# Patient Record
Sex: Female | Born: 1990 | Hispanic: Yes | Marital: Single | Smoking: Current every day smoker
Health system: Southern US, Community
[De-identification: ages and names within clinical notes are randomized; demographics above are authoritative.]

## PROBLEM LIST (undated history)

## (undated) DIAGNOSIS — D75839 Thrombocytosis, unspecified: Secondary | ICD-10-CM

## (undated) DIAGNOSIS — F1910 Other psychoactive substance abuse, uncomplicated: Secondary | ICD-10-CM

## (undated) DIAGNOSIS — F4310 Post-traumatic stress disorder, unspecified: Secondary | ICD-10-CM

## (undated) DIAGNOSIS — R7303 Prediabetes: Secondary | ICD-10-CM

## (undated) DIAGNOSIS — T50901A Poisoning by unspecified drugs, medicaments and biological substances, accidental (unintentional), initial encounter: Secondary | ICD-10-CM

## (undated) DIAGNOSIS — F419 Anxiety disorder, unspecified: Secondary | ICD-10-CM

---

## 2018-03-01 ENCOUNTER — Encounter (HOSPITAL_COMMUNITY): Payer: Self-pay | Admitting: Emergency Medicine

## 2018-03-01 ENCOUNTER — Other Ambulatory Visit: Payer: Self-pay

## 2018-03-01 ENCOUNTER — Emergency Department (HOSPITAL_COMMUNITY)
Admission: EM | Admit: 2018-03-01 | Discharge: 2018-03-01 | Discharge status: Against Medical Advice | Payer: Self-pay | Attending: Emergency Medicine | Admitting: Emergency Medicine

## 2018-03-01 DIAGNOSIS — F17210 Nicotine dependence, cigarettes, uncomplicated: Secondary | ICD-10-CM | POA: Insufficient documentation

## 2018-03-01 DIAGNOSIS — Y999 Unspecified external cause status: Secondary | ICD-10-CM | POA: Insufficient documentation

## 2018-03-01 DIAGNOSIS — S0033XA Contusion of nose, initial encounter: Secondary | ICD-10-CM | POA: Insufficient documentation

## 2018-03-01 DIAGNOSIS — Z5320 Procedure and treatment not carried out because of patient's decision for unspecified reasons: Secondary | ICD-10-CM | POA: Insufficient documentation

## 2018-03-01 DIAGNOSIS — Y939 Activity, unspecified: Secondary | ICD-10-CM | POA: Insufficient documentation

## 2018-03-01 DIAGNOSIS — Y929 Unspecified place or not applicable: Secondary | ICD-10-CM | POA: Insufficient documentation
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
## 2018-05-23 ENCOUNTER — Emergency Department (HOSPITAL_COMMUNITY): Payer: Self-pay

## 2018-05-23 ENCOUNTER — Emergency Department (HOSPITAL_COMMUNITY)
Admission: EM | Admit: 2018-05-23 | Discharge: 2018-05-23 | Disposition: A | Discharge status: Home / Self Care | Payer: Self-pay | Attending: Emergency Medicine | Admitting: Emergency Medicine

## 2018-05-23 ENCOUNTER — Encounter (HOSPITAL_COMMUNITY): Payer: Self-pay | Admitting: Emergency Medicine

## 2018-05-23 DIAGNOSIS — F17210 Nicotine dependence, cigarettes, uncomplicated: Secondary | ICD-10-CM | POA: Insufficient documentation

## 2018-05-23 DIAGNOSIS — T401X1A Poisoning by heroin, accidental (unintentional), initial encounter: Secondary | ICD-10-CM | POA: Insufficient documentation

## 2018-05-23 LAB — CBC WITH DIFFERENTIAL/PLATELET
Abs Immature Granulocytes: 0.03 10*3/uL (ref 0.00–0.07)
Basophils Absolute: 0 10*3/uL (ref 0.0–0.1)
Basophils Relative: 0 %
Eosinophils Absolute: 0.1 10*3/uL (ref 0.0–0.5)
Eosinophils Relative: 1 %
HCT: 38.6 % (ref 36.0–46.0)
Hemoglobin: 12 g/dL (ref 12.0–15.0)
Immature Granulocytes: 0 %
Lymphocytes Relative: 19 %
Lymphs Abs: 1.9 10*3/uL (ref 0.7–4.0)
MCH: 28.3 pg (ref 26.0–34.0)
MCHC: 31.1 g/dL (ref 30.0–36.0)
MCV: 91 fL (ref 80.0–100.0)
Monocytes Absolute: 0.3 10*3/uL (ref 0.1–1.0)
Monocytes Relative: 3 %
Neutro Abs: 8 10*3/uL — ABNORMAL HIGH (ref 1.7–7.7)
Neutrophils Relative %: 77 %
Platelets: 366 10*3/uL (ref 150–400)
RBC: 4.24 MIL/uL (ref 3.87–5.11)
RDW: 14.9 % (ref 11.5–15.5)
WBC: 10.4 10*3/uL (ref 4.0–10.5)
nRBC: 0 % (ref 0.0–0.2)

## 2018-05-23 LAB — COMPREHENSIVE METABOLIC PANEL
ALT: 37 U/L (ref 0–44)
AST: 100 U/L — AB (ref 15–41)
Albumin: 3.5 g/dL (ref 3.5–5.0)
Alkaline Phosphatase: 65 U/L (ref 38–126)
Anion gap: 6 (ref 5–15)
BUN: 8 mg/dL (ref 6–20)
CHLORIDE: 104 mmol/L (ref 98–111)
CO2: 27 mmol/L (ref 22–32)
Calcium: 8.8 mg/dL — ABNORMAL LOW (ref 8.9–10.3)
Creatinine, Ser: 0.71 mg/dL (ref 0.44–1.00)
GFR calc Af Amer: >60 mL/min (ref >60)
GFR calc non Af Amer: >60 mL/min (ref >60)
GLUCOSE: 79 mg/dL (ref 70–99)
Potassium: 3.8 mmol/L (ref 3.5–5.1)
SODIUM: 137 mmol/L (ref 135–145)
Total Bilirubin: 0.5 mg/dL (ref 0.3–1.2)
Total Protein: 7.5 g/dL (ref 6.5–8.1)

## 2018-05-23 LAB — CK: Total CK: 343 U/L — ABNORMAL HIGH (ref 38–234)

## 2018-05-23 LAB — I-STAT BETA HCG BLOOD, ED (MC, WL, AP ONLY): I-stat hCG, quantitative: <5 m[IU]/mL (ref <5)

## 2018-05-23 LAB — ETHANOL: Alcohol, Ethyl (B): <10 mg/dL (ref <10)

## 2018-05-23 MED ORDER — NALOXONE HCL 2 MG/2ML IJ SOSY: 2.0000 mg | PREFILLED_SYRINGE | Freq: Once | INTRAMUSCULAR | Status: DC

## 2018-05-23 MED ORDER — SODIUM CHLORIDE 0.9 % IV BOLUS
1000.0000 mL | Freq: Once | INTRAVENOUS | Status: AC
  Administered 2018-05-23: 1000 mL via INTRAVENOUS

## 2018-05-23 MED ORDER — NALOXONE HCL 4 MG/0.1ML NA LIQD: 1.0000 | Freq: Once | NASAL | Status: DC

## 2018-05-23 MED ORDER — NALOXONE HCL 2 MG/2ML IJ SOSY
PREFILLED_SYRINGE | INTRAMUSCULAR | Status: AC
  Filled 2018-05-23: qty 2

## 2018-05-23 MED ORDER — NALOXONE HCL 2 MG/2ML IJ SOSY
1.5000 mg | PREFILLED_SYRINGE | Freq: Once | INTRAMUSCULAR | Status: AC
  Administered 2018-05-23: 1.5 mg via INTRAVENOUS

## 2018-05-23 MED ORDER — NALOXONE HCL 4 MG/10ML IJ SOLN
0.7500 mg/h | INTRAVENOUS | Status: DC
  Filled 2018-05-23: qty 10

## 2018-05-23 NOTE — Discharge Instructions (Signed)
You are brought to the ER after being found unresponsive outside.  Your mental status and respiratory drive for depressed likely from heroin use.  You metabolized and returned to baseline after observation in the ER.  Labs, head CT and EKG were unremarkable.  We have given you Narcan intranasal to use as needed when there is similar mental status or respiratory depression.  See attached information.  See resources for outpatient substance abuse resources.  Return to the ER for chest pain, shortness of breath, fevers or similar decrease mentation and respiratory drive.
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
## 2018-05-23 NOTE — ED Notes (Addendum)
Pt's sister coming to get pt. Pt axox4

## 2018-05-23 NOTE — ED Notes (Signed)
Sister now at bedside. Pt arousable to abnoxious stimuli. Airway intact. VSS.

## 2018-05-23 NOTE — ED Notes (Signed)
Pt arousable to speech, staying awake more now. Pt to be d/c home with her sister driving. Sister will be given intranasal narcan to take home.

## 2018-05-23 NOTE — ED Notes (Signed)
Patient placed on 2L O2 [place].  
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
## 2018-05-23 NOTE — ED Notes (Signed)
Pt stood up and vomited in an emesis basin that was laying in the floor and then turned around and urinated in it. Then escorted back to bed. NAD.
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
## 2018-05-23 NOTE — ED Notes (Signed)
Pt in CT.

## 2018-06-17 ENCOUNTER — Emergency Department (HOSPITAL_COMMUNITY)
Admission: EM | Admit: 2018-06-17 | Discharge: 2018-06-17 | Disposition: A | Discharge status: Home / Self Care | Payer: Medicaid Other | Attending: Emergency Medicine | Admitting: Emergency Medicine

## 2018-06-17 ENCOUNTER — Other Ambulatory Visit: Payer: Self-pay

## 2018-06-17 DIAGNOSIS — F1420 Cocaine dependence, uncomplicated: Secondary | ICD-10-CM | POA: Insufficient documentation

## 2018-06-17 DIAGNOSIS — F1120 Opioid dependence, uncomplicated: Secondary | ICD-10-CM | POA: Insufficient documentation

## 2018-06-17 DIAGNOSIS — R45851 Suicidal ideations: Secondary | ICD-10-CM | POA: Insufficient documentation

## 2018-06-17 DIAGNOSIS — F332 Major depressive disorder, recurrent severe without psychotic features: Secondary | ICD-10-CM | POA: Insufficient documentation

## 2018-06-17 DIAGNOSIS — R462 Strange and inexplicable behavior: Secondary | ICD-10-CM

## 2018-06-17 DIAGNOSIS — F1320 Sedative, hypnotic or anxiolytic dependence, uncomplicated: Secondary | ICD-10-CM | POA: Insufficient documentation

## 2018-06-17 DIAGNOSIS — F1910 Other psychoactive substance abuse, uncomplicated: Secondary | ICD-10-CM

## 2018-06-17 LAB — CBC WITH DIFFERENTIAL/PLATELET
Abs Immature Granulocytes: 0.03 10*3/uL (ref 0.00–0.07)
Basophils Absolute: 0.1 10*3/uL (ref 0.0–0.1)
Basophils Relative: 1 %
Eosinophils Absolute: 0.2 10*3/uL (ref 0.0–0.5)
Eosinophils Relative: 2 %
HEMATOCRIT: 39.7 % (ref 36.0–46.0)
HEMOGLOBIN: 12.5 g/dL (ref 12.0–15.0)
Immature Granulocytes: 0 %
LYMPHS PCT: 36 %
Lymphs Abs: 3.6 10*3/uL (ref 0.7–4.0)
MCH: 28.5 pg (ref 26.0–34.0)
MCHC: 31.5 g/dL (ref 30.0–36.0)
MCV: 90.6 fL (ref 80.0–100.0)
MONO ABS: 0.9 10*3/uL (ref 0.1–1.0)
MONOS PCT: 9 %
NEUTROS ABS: 5.1 10*3/uL (ref 1.7–7.7)
Neutrophils Relative %: 52 %
Platelets: 380 10*3/uL (ref 150–400)
RBC: 4.38 MIL/uL (ref 3.87–5.11)
RDW: 14.6 % (ref 11.5–15.5)
WBC: 9.8 10*3/uL (ref 4.0–10.5)
nRBC: 0 % (ref 0.0–0.2)

## 2018-06-17 LAB — COMPREHENSIVE METABOLIC PANEL
ALT: 10 U/L (ref 0–44)
AST: 27 U/L (ref 15–41)
Albumin: 3.8 g/dL (ref 3.5–5.0)
Alkaline Phosphatase: 69 U/L (ref 38–126)
Anion gap: 9 (ref 5–15)
BUN: 9 mg/dL (ref 6–20)
CHLORIDE: 104 mmol/L (ref 98–111)
CO2: 24 mmol/L (ref 22–32)
CREATININE: 0.71 mg/dL (ref 0.44–1.00)
Calcium: 9.3 mg/dL (ref 8.9–10.3)
GFR calc Af Amer: >60 mL/min (ref >60)
GFR calc non Af Amer: >60 mL/min (ref >60)
Glucose, Bld: 85 mg/dL (ref 70–99)
Potassium: 3.5 mmol/L (ref 3.5–5.1)
SODIUM: 137 mmol/L (ref 135–145)
Total Bilirubin: 0.4 mg/dL (ref 0.3–1.2)
Total Protein: 8.2 g/dL — ABNORMAL HIGH (ref 6.5–8.1)

## 2018-06-17 LAB — ETHANOL: Alcohol, Ethyl (B): <10 mg/dL (ref <10)

## 2018-06-17 LAB — I-STAT BETA HCG BLOOD, ED (MC, WL, AP ONLY): I-stat hCG, quantitative: <5 m[IU]/mL (ref <5)

## 2018-06-17 MED ORDER — LORAZEPAM 1 MG PO TABS
1.0000 mg | ORAL_TABLET | Freq: Once | ORAL | Status: AC
  Administered 2018-06-17: 1 mg via ORAL
  Filled 2018-06-17: qty 1

## 2018-06-17 NOTE — ED Notes (Signed)
Per Teodoro Kil, [facility] patient is psych cleared at this time.

## 2018-06-17 NOTE — Discharge Instructions (Addendum)
There is help if you need it.  Please do not use dirty needles, this could cause you a severe infection to your skin, heart or spinal cord.  This could kill you or leave you permanently disabled.    There has been a recent study in this area that shows if you have an opiod overdose your risk of death is 12% in the next year.  Please follow up with help if you wish to seek it.   [facility] Solution to the Opioid Problem ([facility]STOP) Fixed; mobile; peer-based Chase Holleman [phone] [email or web address] Fixed site exchange at [facility], Wednesdays (2-5pm) and Thursdays (3-8pm). [address] [place], [place] [address] Call or text to arrange mobile and peer exchange, Mondays (1-4pm) and Fridays (4-7pm). Serving [place]

## 2018-06-17 NOTE — ED Triage Notes (Signed)
Pt BIB [facility]. EMS reports that patient admitted to using heroin and what she believes is fentanyl. Per EMS pt reported that she was suicidal and was trying to jump into traffic earlier today. Pt visibly upset upon arrival to ED. Pt denying any suicidal or homicidal thoughts at this time and states she "just wants to live." Pt does appear very restless upon arrival to ED.

## 2018-06-17 NOTE — BH Assessment (Signed)
TTS attempted to contact Pt's mother Shelly Rubenstein [phone].  There was not an answer and a HIPPA compliant message was left on the voicemail.

## 2018-06-17 NOTE — ED Notes (Signed)
Dinner tray ordered.

## 2018-06-17 NOTE — ED Notes (Signed)
Pt refusing to sign at this time. Pt given discharge instructions and follow up information. Pt verbalized understanding and given the opportunity to ask questions. Pt discharged from the ED.
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
## 2018-06-17 NOTE — ED Provider Notes (Signed)
[facility] EMERGENCY DEPARTMENT Provider Note   CSN: [id number] Arrival date & time: 06/17/18  1305    History   Chief Complaint Chief Complaint  Patient presents with  . Suicidal    HPI Allison Woodard is a 28 y.o. female.     28 year old female presents with bizarre behavior.  EMS reports that they picked the patient up at the side of the road where she was trying to jump in front of traffic.  She admits to using drugs today, thinks that she used fentanyl.  Admits to heroin use usually.  She reports snorting the drug.  She reports history of bipolar disorder, is not currently on medications for it.  She was agitated and combative for EMS and had to be placed in soft restraints.  LEVEL 5 CAVEAT DUE TO AMS  The history is provided by the EMS personnel. The history is limited by the condition of the patient.    No past medical history on file.  There are no active problems to display for this patient.   No past surgical history on file.   OB History   No obstetric history on file.      Home Medications    Prior to Admission medications   Not on File    Family History No family history on file.  Social History Social History   Tobacco Use  . Smoking status: Current Every Day Smoker  . Smokeless tobacco: Current User  Substance Use Topics  . Alcohol use: Not Currently  . Drug use: Yes    Types: Cocaine     Allergies   Patient has no known allergies.   Review of Systems Review of Systems  Unable to perform ROS: Mental status change     Physical Exam Updated Vital Signs BP 117/90 (BP Location: Right Arm)   Pulse 95   Temp 97.9 F (36.6 C) (Oral)   Resp 18   Ht 5' 2.5" (1.588 m)   Wt 49.9 kg   SpO2 99%   BMI 19.80 kg/m   Physical Exam Vitals signs and nursing note reviewed.  Constitutional:      Appearance: She is well-developed.     Comments: Disheveled, anxious  HENT:     Head: Normocephalic and atraumatic.   Nose: Nose normal.     Mouth/Throat:     Mouth: Mucous membranes are moist.  Eyes:     Conjunctiva/sclera: Conjunctivae normal.     Pupils: Pupils are equal, round, and reactive to light.  Neck:     Musculoskeletal: Neck supple.  Cardiovascular:     Rate and Rhythm: Normal rate and regular rhythm.     Heart sounds: Normal heart sounds. No murmur.  Pulmonary:     Effort: Pulmonary effort is normal.     Breath sounds: Normal breath sounds.  Abdominal:     General: Bowel sounds are normal. There is no distension.     Palpations: Abdomen is soft.     Tenderness: There is no abdominal tenderness.  Skin:    General: Skin is warm and dry.  Neurological:     Mental Status: She is alert and oriented to person, place, and time.     Comments: Fluent speech  Psychiatric:        Attention and Perception: She is inattentive.        Mood and Affect: Mood is anxious. Affect is labile.        Speech: Speech is rapid and pressured.  Behavior: Behavior is agitated.        Cognition and Memory: Cognition is impaired.        Judgment: Judgment is impulsive.      ED Treatments / Results  Labs (all labs ordered are listed, but only abnormal results are displayed) Labs Reviewed  COMPREHENSIVE METABOLIC PANEL  ETHANOL  RAPID URINE DRUG SCREEN, HOSP PERFORMED  CBC WITH DIFFERENTIAL/PLATELET  I-STAT BETA HCG BLOOD, ED ([facility], [facility], [facility] ONLY)    EKG None  Radiology No results found.  Procedures Procedures (including critical care time)  Medications Ordered in ED Medications  LORazepam (ATIVAN) tablet 1 mg (1 mg Oral Given 06/17/18 1351)     Initial Impression / Assessment and Plan / ED Course  I have reviewed the triage vital signs and the nursing notes.  Pertinent labs & imaging results that were available during my care of the patient were reviewed by me and considered in my medical decision making (see chart for details).       We were able to verbally de-escalate the  patient on arrival and take her out of soft restraints.  Gave oral Ativan.  Her behavior was concerning for potential self-harm therefore ordered sitter and TTS evaluation.  I am signing out to the oncoming provider who will follow up on TTS recommendations and screening lab work. If patient tries to leave prior to TTS evaluation, she will be IVC'd as she is altered 2/2 drug use and cannot reliably assess for safety.  Final Clinical Impressions(s) / ED Diagnoses   Final diagnoses:  None    ED Discharge Orders    None       Kyleeann Cremeans, Ambrose Finland, MD 06/17/18 1431

## 2018-06-17 NOTE — ED Notes (Signed)
Pt refusing lab work at this time

## 2018-06-17 NOTE — ED Provider Notes (Signed)
I received the patient in signout from Dr. Clarene Duke.  Briefly she is a 28 year old female that came in with a chief complaint of suicidal ideation after abusing drugs.  She has metabolized her illegal substances and is now no longer saying that she is suicidal.  She has been seen by TTS and they have cleared her psychiatrically.  Her lab work has returned and is unremarkable.   Melene Plan, DO 06/17/18 1755

## 2018-09-07 ENCOUNTER — Ambulatory Visit (HOSPITAL_COMMUNITY)
Admission: EM | Admit: 2018-09-07 | Discharge: 2018-09-07 | Disposition: A | Discharge status: Home / Self Care | Payer: Self-pay | Attending: Psychiatry | Admitting: Psychiatry

## 2018-09-07 ENCOUNTER — Emergency Department (HOSPITAL_COMMUNITY)
Admission: EM | Admit: 2018-09-07 | Discharge: 2018-09-07 | Disposition: A | Discharge status: Other Acute Inpatient Hospital | Payer: Medicaid Other | Attending: Emergency Medicine | Admitting: Emergency Medicine

## 2018-09-07 ENCOUNTER — Other Ambulatory Visit: Payer: Self-pay | Admitting: Family

## 2018-09-07 ENCOUNTER — Inpatient Hospital Stay (HOSPITAL_COMMUNITY)
Admission: AD | Admit: 2018-09-07 | Discharge: 2018-09-11 | DRG: 897 | Disposition: A | Discharge status: Home / Self Care | Payer: No Typology Code available for payment source | Source: Intra-hospital | Attending: Psychiatry | Admitting: Psychiatry

## 2018-09-07 DIAGNOSIS — F15259 Other stimulant dependence with stimulant-induced psychotic disorder, unspecified: Secondary | ICD-10-CM | POA: Diagnosis present

## 2018-09-07 DIAGNOSIS — R440 Auditory hallucinations: Secondary | ICD-10-CM | POA: Insufficient documentation

## 2018-09-07 DIAGNOSIS — F23 Brief psychotic disorder: Secondary | ICD-10-CM | POA: Insufficient documentation

## 2018-09-07 DIAGNOSIS — F22 Delusional disorders: Secondary | ICD-10-CM | POA: Diagnosis present

## 2018-09-07 DIAGNOSIS — F1410 Cocaine abuse, uncomplicated: Secondary | ICD-10-CM | POA: Insufficient documentation

## 2018-09-07 DIAGNOSIS — F609 Personality disorder, unspecified: Secondary | ICD-10-CM | POA: Diagnosis present

## 2018-09-07 DIAGNOSIS — Z046 Encounter for general psychiatric examination, requested by authority: Secondary | ICD-10-CM | POA: Insufficient documentation

## 2018-09-07 DIAGNOSIS — R451 Restlessness and agitation: Secondary | ICD-10-CM | POA: Insufficient documentation

## 2018-09-07 DIAGNOSIS — R456 Violent behavior: Secondary | ICD-10-CM | POA: Insufficient documentation

## 2018-09-07 DIAGNOSIS — Z1159 Encounter for screening for other viral diseases: Secondary | ICD-10-CM | POA: Insufficient documentation

## 2018-09-07 DIAGNOSIS — F17290 Nicotine dependence, other tobacco product, uncomplicated: Secondary | ICD-10-CM | POA: Insufficient documentation

## 2018-09-07 DIAGNOSIS — F309 Manic episode, unspecified: Secondary | ICD-10-CM | POA: Insufficient documentation

## 2018-09-07 DIAGNOSIS — Z590 Homelessness: Secondary | ICD-10-CM

## 2018-09-07 DIAGNOSIS — F29 Unspecified psychosis not due to a substance or known physiological condition: Secondary | ICD-10-CM | POA: Diagnosis present

## 2018-09-07 DIAGNOSIS — F17200 Nicotine dependence, unspecified, uncomplicated: Secondary | ICD-10-CM | POA: Diagnosis present

## 2018-09-07 DIAGNOSIS — Z781 Physical restraint status: Secondary | ICD-10-CM

## 2018-09-07 DIAGNOSIS — F15959 Other stimulant use, unspecified with stimulant-induced psychotic disorder, unspecified: Secondary | ICD-10-CM | POA: Diagnosis not present

## 2018-09-07 LAB — COMPREHENSIVE METABOLIC PANEL
ALT: 14 U/L (ref 0–44)
AST: 31 U/L (ref 15–41)
Albumin: 3.5 g/dL (ref 3.5–5.0)
Alkaline Phosphatase: 76 U/L (ref 38–126)
Anion gap: 10 (ref 5–15)
BUN: 14 mg/dL (ref 6–20)
CO2: 21 mmol/L — ABNORMAL LOW (ref 22–32)
Calcium: 8.6 mg/dL — ABNORMAL LOW (ref 8.9–10.3)
Chloride: 106 mmol/L (ref 98–111)
Creatinine, Ser: 0.7 mg/dL (ref 0.44–1.00)
GFR calc Af Amer: >60 mL/min (ref >60)
GFR calc non Af Amer: >60 mL/min (ref >60)
Glucose, Bld: 81 mg/dL (ref 70–99)
Potassium: 3.4 mmol/L — ABNORMAL LOW (ref 3.5–5.1)
Sodium: 137 mmol/L (ref 135–145)
Total Bilirubin: 0.4 mg/dL (ref 0.3–1.2)
Total Protein: 7.1 g/dL (ref 6.5–8.1)

## 2018-09-07 LAB — ETHANOL: Alcohol, Ethyl (B): <10 mg/dL (ref <10)

## 2018-09-07 LAB — RAPID URINE DRUG SCREEN, HOSP PERFORMED
Amphetamines: POSITIVE — AB
Barbiturates: NOT DETECTED
Benzodiazepines: NOT DETECTED
Cocaine: NOT DETECTED
Opiates: NOT DETECTED
Tetrahydrocannabinol: NOT DETECTED

## 2018-09-07 LAB — CBC WITH DIFFERENTIAL/PLATELET
Abs Immature Granulocytes: 0.01 10*3/uL (ref 0.00–0.07)
Basophils Absolute: 0 10*3/uL (ref 0.0–0.1)
Basophils Relative: 1 %
Eosinophils Absolute: 0 10*3/uL (ref 0.0–0.5)
Eosinophils Relative: 0 %
HCT: 39.2 % (ref 36.0–46.0)
Hemoglobin: 12.5 g/dL (ref 12.0–15.0)
Immature Granulocytes: 0 %
Lymphocytes Relative: 28 %
Lymphs Abs: 1.2 10*3/uL (ref 0.7–4.0)
MCH: 28 pg (ref 26.0–34.0)
MCHC: 31.9 g/dL (ref 30.0–36.0)
MCV: 87.7 fL (ref 80.0–100.0)
Monocytes Absolute: 0.6 10*3/uL (ref 0.1–1.0)
Monocytes Relative: 15 %
Neutro Abs: 2.3 10*3/uL (ref 1.7–7.7)
Neutrophils Relative %: 56 %
Platelets: 274 10*3/uL (ref 150–400)
RBC: 4.47 MIL/uL (ref 3.87–5.11)
RDW: 14.3 % (ref 11.5–15.5)
WBC: 4.2 10*3/uL (ref 4.0–10.5)
nRBC: 0 % (ref 0.0–0.2)

## 2018-09-07 LAB — URINALYSIS, ROUTINE W REFLEX MICROSCOPIC
Bilirubin Urine: NEGATIVE
Glucose, UA: NEGATIVE mg/dL
Ketones, ur: 20 mg/dL — AB
Leukocytes,Ua: NEGATIVE
Nitrite: NEGATIVE
Protein, ur: NEGATIVE mg/dL
RBC / HPF: >50 RBC/hpf — ABNORMAL HIGH (ref 0–5)
Specific Gravity, Urine: 1.033 — ABNORMAL HIGH (ref 1.005–1.030)
pH: 5 (ref 5.0–8.0)

## 2018-09-07 LAB — PROTIME-INR
INR: 1.1 (ref 0.8–1.2)
Prothrombin Time: 14.4 seconds (ref 11.4–15.2)

## 2018-09-07 LAB — SARS CORONAVIRUS 2 BY RT PCR (HOSPITAL ORDER, PERFORMED IN ~~LOC~~ HOSPITAL LAB): SARS Coronavirus 2: NEGATIVE

## 2018-09-07 LAB — ACETAMINOPHEN LEVEL: Acetaminophen (Tylenol), Serum: <10 ug/mL — ABNORMAL LOW (ref 10–30)

## 2018-09-07 LAB — SALICYLATE LEVEL: Salicylate Lvl: <7 mg/dL (ref 2.8–30.0)

## 2018-09-07 LAB — I-STAT BETA HCG BLOOD, ED (MC, WL, AP ONLY): I-stat hCG, quantitative: <5 m[IU]/mL (ref <5)

## 2018-09-07 LAB — TSH: TSH: 0.673 u[IU]/mL (ref 0.350–4.500)

## 2018-09-07 MED ORDER — SODIUM CHLORIDE 0.9 % IV BOLUS
1000.0000 mL | Freq: Once | INTRAVENOUS | Status: AC
  Administered 2018-09-07: 1000 mL via INTRAVENOUS

## 2018-09-07 MED ORDER — MAGNESIUM HYDROXIDE 400 MG/5ML PO SUSP: 30.0000 mL | Freq: Every day | ORAL | Status: DC | PRN

## 2018-09-07 MED ORDER — TRAZODONE HCL 50 MG PO TABS: 50.0000 mg | ORAL_TABLET | Freq: Every evening | ORAL | Status: DC | PRN

## 2018-09-07 MED ORDER — ZIPRASIDONE MESYLATE 20 MG IM SOLR
10.0000 mg | Freq: Once | INTRAMUSCULAR | Status: AC
  Administered 2018-09-07: 12:00:00 10 mg via INTRAMUSCULAR
  Filled 2018-09-07: qty 20

## 2018-09-07 MED ORDER — TRAZODONE HCL 50 MG PO TABS
50.0000 mg | ORAL_TABLET | Freq: Every evening | ORAL | Status: DC | PRN
  Administered 2018-09-09 – 2018-09-10 (×3): 50 mg via ORAL
  Filled 2018-09-07 (×13): qty 1

## 2018-09-07 MED ORDER — STERILE WATER FOR INJECTION IJ SOLN
INTRAMUSCULAR | Status: AC
  Filled 2018-09-07: qty 10

## 2018-09-07 MED ORDER — OLANZAPINE 5 MG PO TBDP
5.0000 mg | ORAL_TABLET | Freq: Once | ORAL | Status: AC
  Administered 2018-09-08: 03:00:00 5 mg via ORAL
  Filled 2018-09-07 (×2): qty 1

## 2018-09-07 MED ORDER — LORAZEPAM 1 MG PO TABS
1.0000 mg | ORAL_TABLET | Freq: Once | ORAL | Status: AC
  Administered 2018-09-08: 1 mg via ORAL
  Filled 2018-09-07: qty 1

## 2018-09-07 MED ORDER — ACETAMINOPHEN 325 MG PO TABS: 650.0000 mg | ORAL_TABLET | Freq: Four times a day (QID) | ORAL | Status: DC | PRN

## 2018-09-07 MED ORDER — ALUM & MAG HYDROXIDE-SIMETH 200-200-20 MG/5ML PO SUSP: 30.0000 mL | ORAL | Status: DC | PRN

## 2018-09-07 NOTE — ED Notes (Signed)
Called communications for transport to [facility]

## 2018-09-07 NOTE — ED Notes (Signed)
MD PFEIFFER TO BEDSIDE TO ASSESS PT

## 2018-09-07 NOTE — ED Notes (Signed)
Pt arrives being carried by [employer] in handcuffs. Per [employer], mother stated that pt was manic and delusional onset last evening and she called [employer] as "she couldn't have her in her house anymore". Pt appears manic and acutely psychotic on arrival. She is perseverating on the idea of "my sister is dead". She is having violent outbursts, striking out and kicking staff members. Intermittently screaming and non compliant w/ instruction. Security called to bedside, [employer] remains at bedside. MD Pfeiffer to bedside to assess need for restraint and medication.

## 2018-09-07 NOTE — ED Provider Notes (Signed)
MSE was initiated and I personally evaluated the patient and placed orders (if any) at  12:12 PM on September 07, 2018.  The patient appears stable so that the remainder of the MSE may be completed by another provider.  Patient seen in triage.  Referred from behavioral health for psychotic behavior, been brought to be HH by [employer].  Significant use of polysubstance abuse.  Patient is extremely paranoid and disheveled.  She is agitated and alert in appearance.  Constant stream of conversation much of a paranoid and delusional.  Heart is regular.  Lungs are clear.  Pupils are symmetric 3 mm responsive with normal extraocular motions.  All 4 extremities are strong and working in coordinated fashion.  I was asked to assess patient in triage for agitation.  Patient to be given Geodon 10 mg IM and start psychiatric clearance evaluation.  Patient is psychotic at this time.  She is agitated and physically aggressive.   Arby Barrette, MD 09/07/18 1215

## 2018-09-07 NOTE — BH Assessment (Signed)
Assessment Note  Allison Woodard is an 28 y.o. female presenting voluntarily to [facility] for assessment via [employer]. Since arrival [facility] provider has initiated IVC. Patient is a poor historian due to AMS so chart review and collateral information was used to complete assessment. Patient appears manic and delusional. Patient's speech is rapid, aggressive and her thoughts are disorganized. Patient stated several times that she may be pregnant. Patient appears suspicious calling this writer a "bitch." Per chart review, patient does not have any prior psychiatric history, however has a history of polysubstance use. Patient was assessed by TTS in 06/2018 following a heroin overdose and was psychiatrically cleared. Patient needs to be medically cleared as symptoms may be substance induced.  Per [employer]: Patient voluntarily came to [facility] via [employer] after her mother contacted them due to her erratic behavior. After arriving to [facility] patient showed a change in presentation, becoming agitated.   Patient appeared disoriented. Her speech is aggressive and thoughts are disorganized. Her mood is irritable and affect is congruent. She has poor insight, judgement, and impulse control.  Diagnosis: F23 Brief psychotic disorder   R/O Substance induced psychosis  Past Medical History: No past medical history on file.  No past surgical history on file.  Family History: No family history on file.  Social History:  reports that she has been smoking. She uses smokeless tobacco. She reports previous alcohol use. She reports current drug use. Drug: Cocaine.  Additional Social History:  Alcohol / Drug Use Pain Medications: see MAR Prescriptions: see MAR Over the Counter: see MAR History of alcohol / drug use?: Yes Substance #1 Name of Substance 1: heroin 1 - Age of First Use: 15 1 - Amount (size/oz): $10 worth 1 - Frequency: UTA 1 - Duration: UTA 1 - Last Use / Amount: UTA Substance #2 Name of Substance 2: Ecstacy 2 - Age of First Use:  UTA 2 - Amount (size/oz): UTA 2 - Frequency: UTA 2 - Duration: UTA 2 - Last Use / Amount: UTA Substance #3 Name of Substance 3: cocaine 3 - Age of First Use: UTA 3 - Amount (size/oz): UTA 3 - Frequency: UTA 3 - Duration: UTA 3 - Last Use / Amount: UTA Substance #4 Name of Substance 4: Percocets 4 - Age of First Use: UTA 4 - Amount (size/oz): UTA 4 - Frequency: UTA 4 - Duration: UTA 4 - Last Use / Amount: UTA  CIWA: CIWA-Ar BP: (!) 131/104 Pulse Rate: (!) 115 COWS:    Allergies: No Known Allergies  Home Medications: (Not in a hospital admission)   OB/GYN Status:  No LMP recorded.  General Assessment Data Location of Assessment: [facility] TTS Assessment: In system Is this a Tele or Face-to-Face Assessment?: Face-to-Face Is this an Initial Assessment or a Re-assessment for this encounter?: Initial Assessment Patient Accompanied by:: ([employer]) Language Other than English: No Living Arrangements: (mother) What gender do you identify as?: Female Marital status: (UTA) Maiden name: Weitzel Pregnancy Status: Other (Comment)(patient expressed concern she may be pregnant) Living Arrangements: Parent Can pt return to current living arrangement?: (UTA) Admission Status: Involuntary Petitioner: ED Attending Is patient capable of signing voluntary admission?: No Referral Source: Self/Family/Friend Insurance type: None     Crisis Care Plan Living Arrangements: Parent Legal Guardian: (self) Name of Psychiatrist: none Name of Therapist: none  Education Status Is patient currently in school?: (UTA)  Risk to self with the past 6 months Suicidal Ideation: No Has patient been a risk to self within the past 6 months  prior to admission? : Yes Suicidal Intent: No Has patient had any suicidal intent within the past 6 months prior to admission? : No Is patient at risk for suicide?: No Suicidal Plan?: No Has patient had any suicidal plan within the past 6 months  prior to admission? : No Access to Means: No What has been your use of drugs/alcohol within the last 12 months?: polysubstance use Previous Attempts/Gestures: No How many times?: 0 Other Self Harm Risks: substance use Triggers for Past Attempts: Unknown Intentional Self Injurious Behavior: None Family Suicide History: Unable to assess Recent stressful life event(s): (UTA) Persecutory voices/beliefs?: No Depression: (UTA) Depression Symptoms: (UTA) Substance abuse history and/or treatment for substance abuse?: No Suicide prevention information given to non-admitted patients: Not applicable  Risk to Others within the past 6 months Homicidal Ideation: No Does patient have any lifetime risk of violence toward others beyond the six months prior to admission? : No Thoughts of Harm to Others: No Current Homicidal Intent: No Current Homicidal Plan: No Access to Homicidal Means: No Identified Victim: none History of harm to others?: No Assessment of Violence: None Noted Violent Behavior Description: none noted Does patient have access to weapons?: No Criminal Charges Pending?: (UTA) Does patient have a court date: (UTA) Is patient on probation?: (UTA)  Psychosis Hallucinations: None noted Delusions: Persecutory, Unspecified  Mental Status Report Appearance/Hygiene: Disheveled Eye Contact: Poor Motor Activity: Hyperactivity, Mannerisms Speech: Aggressive, Loud, Word salad Level of Consciousness: Irritable, Alert Mood: Irritable Affect: Irritable Anxiety Level: Minimal Thought Processes: Flight of Ideas Judgement: Impaired Orientation: Unable to assess Obsessive Compulsive Thoughts/Behaviors: None  Cognitive Functioning Concentration: Poor Memory: Unable to Assess Is patient IDD: No Insight: Poor Impulse Control: Poor Appetite: (UTA) Have you had any weight changes? : (UTA) Sleep: Unable to Assess Total Hours of Sleep: (UTA) Vegetative Symptoms: Unable to  Assess  ADLScreening [facility] Assessment Services) Patient's cognitive ability adequate to safely complete daily activities?: Yes Patient able to express need for assistance with ADLs?: Yes Independently performs ADLs?: Yes (appropriate for developmental age)  Prior Inpatient Therapy Prior Inpatient Therapy: No  Prior Outpatient Therapy Prior Outpatient Therapy: No Does patient have an ACCT team?: No Does patient have Intensive In-House Services?  : No Does patient have [facility] services? : No Does patient have P4CC services?: No  ADL Screening (condition at time of admission) Patient's cognitive ability adequate to safely complete daily activities?: Yes Is the patient deaf or have difficulty hearing?: No Does the patient have difficulty seeing, even when wearing glasses/contacts?: No Does the patient have difficulty concentrating, remembering, or making decisions?: Yes Patient able to express need for assistance with ADLs?: Yes Does the patient have difficulty dressing or bathing?: No Independently performs ADLs?: Yes (appropriate for developmental age) Does the patient have difficulty walking or climbing stairs?: No Weakness of Legs: None Weakness of Arms/Hands: None  Home Assistive Devices/Equipment Home Assistive Devices/Equipment: None  Therapy Consults (therapy consults require a physician order) PT Evaluation Needed: No OT Evalulation Needed: No SLP Evaluation Needed: No Abuse/Neglect Assessment (Assessment to be complete while patient is alone) Abuse/Neglect Assessment Can Be Completed: Unable to assess, patient is non-responsive or altered mental status Values / Beliefs Cultural Requests During Hospitalization: None Spiritual Requests During Hospitalization: None Consults Spiritual Care Consult Needed: No Social Work Consult Needed: No Merchant navy officer (For Healthcare) Does Patient Have a Medical Advance Directive?: Unable to assess, patient is non-responsive or  altered mental status          Disposition: Alcario Drought  Melvyn NethLewis, NP recommends in patient treatment. Patient moved to ED for medical clearance.  Disposition Initial Assessment Completed for this Encounter: Yes [facility] Patient refused recommended treatment: No  On Site Evaluation by:   Reviewed with Physician:    Celedonio MiyamotoMeredith  Anquanette Bahner 09/07/2018 11:30 AM

## 2018-09-07 NOTE — ED Provider Notes (Signed)
Pt has been medically cleared for psychiatric treatment.    Raeford Razor, MD 09/07/18 [phone]

## 2018-09-07 NOTE — ED Triage Notes (Signed)
Per [employer], pt arrives from [facility] and was sent over here "because she was too agitated". [employer] was over at [facility] w/ pt for approx 90 minutes, during which she became increasingly agitated and started becoming destructive and combative while in their department. [employer] was initially called by pts mother who stated she was a danger to "everyone in the house" d/t her acute psychoses. Pt unable to provide hx to this RN as she is too combative at this time

## 2018-09-07 NOTE — ED Provider Notes (Signed)
Tried taking pt out of restraints. When stimulated though she becomes markedly agitated. Only able to take R wrist out and she began fighting and trying to bite. Not able to redirect or reason with her at all. Unfortunately had to re-restrain.    Raeford Razor, MD 09/07/18 [phone]

## 2018-09-07 NOTE — Progress Notes (Signed)
Pt up trying to go into other peoples rooms. Pt irritable and has no insight , pt rude and disrespectful

## 2018-09-07 NOTE — ED Provider Notes (Signed)
[facility] Provider Note   CSN: [id number] Arrival date & time: 09/07/18  1153    History   Chief Complaint Chief Complaint  Patient presents with  . Medical Clearance  . Psychiatric Evaluation    HPI Allison Woodard is a 28 y.o. female.    Level 5 caveat secondary to behavioral health disorder and possible overdose HPI  28 [place] old [place] [employer] with agitation, psychosis, and probable drug overdose.  Per the reports she has been hearing voices and threatening her mother.  Initial IVC papers taken out and signed at overall health.  She is unable to me any history.  No past medical history on file.  There are no active problems to display for this patient.   No past surgical history on file.   OB History   No obstetric history on file.      Home Medications    Prior to Admission medications   Not on File    Family History No family history on file.  Social History Social History   Tobacco Use  . Smoking status: Current Every Day Smoker  . Smokeless tobacco: Current User  Substance Use Topics  . Alcohol use: Not Currently  . Drug use: Yes    Types: Cocaine     Allergies   Patient has no known allergies.   Review of Systems Review of Systems  Unable to perform ROS: Mental status change     Physical Exam Updated Vital Signs BP 111/72   Pulse (!) 101   Temp 98.2 F (36.8 C) (Oral)   Resp 15   SpO2 99%   Physical Exam Vitals signs and nursing note reviewed.  Constitutional:      Appearance: She is ill-appearing.     Comments: Patient brought in by [employer] and initially with some thrashing and yelling and placed in four-point restraints  HENT:     Head: Normocephalic and atraumatic.     Right Ear: External ear normal.     Left Ear: External ear normal.     Mouth/Throat:     Mouth: Mucous membranes are moist.  Eyes:     Comments: Pupils are pinpoint  Neck:   Musculoskeletal: Normal range of motion.  Cardiovascular:     Rate and Rhythm: Normal rate and regular rhythm.     Pulses: Normal pulses.  Pulmonary:     Breath sounds: Normal breath sounds.  Abdominal:     General: Abdomen is flat.  Musculoskeletal: Normal range of motion.  Skin:    General: Skin is warm.     Capillary Refill: Capillary refill takes less than 2 seconds.  Neurological:     General: No focal deficit present.     Comments: Patient is agitated and intermittently unresponsive  Psychiatric:        Attention and Perception: She perceives auditory hallucinations.        Mood and Affect: Affect is inappropriate.        Speech: She is noncommunicative.        Behavior: Behavior is agitated.        Cognition and Memory: Cognition is impaired.      ED Treatments / Results  Labs (all labs ordered are listed, but only abnormal results are displayed) Labs Reviewed  COMPREHENSIVE METABOLIC PANEL - Abnormal; Notable for the following components:      Result Value   Potassium 3.4 (*)    CO2 21 (*)    Calcium  8.6 (*)    All other components within normal limits  ACETAMINOPHEN LEVEL - Abnormal; Notable for the following components:   Acetaminophen (Tylenol), Serum <10 (*)    All other components within normal limits  ETHANOL  SALICYLATE LEVEL  CBC WITH DIFFERENTIAL/PLATELET  PROTIME-INR  TSH  URINALYSIS, ROUTINE W REFLEX MICROSCOPIC  RAPID URINE DRUG SCREEN, HOSP PERFORMED  I-STAT BETA HCG BLOOD, ED ([facility], [facility], [facility] ONLY)    EKG EKG Interpretation  Date/Time:  Monday September 07 2018 12:50:40 EDT Ventricular Rate:  104 PR Interval:    QRS Duration: 73 QT Interval:  320 QTC Calculation: 421 R Axis:   26 Text Interpretation:  Sinus tachycardia Otherwise within normal limits Confirmed by Margarita Grizzle [phone]) on 09/07/2018 3:18:49 PM   Radiology No results found.  Procedures Procedures (including critical care time)  Medications Ordered in ED Medications  sterile  water (preservative free) injection (has no administration in time range)  ziprasidone (GEODON) injection 10 mg (10 mg Intramuscular Given 09/07/18 1210)  sodium chloride 0.9 % bolus 1,000 mL (1,000 mLs Intravenous New Bag/Given 09/07/18 1500)     Initial Impression / Assessment and Plan / ED Course  I have reviewed the triage vital signs and the nursing notes.  Pertinent labs & imaging results that were available during my care of the patient were reviewed by me and considered in my medical decision making (see chart for details).       Patient with reports of psychotic behavior as well as possible overdose Labs pending She appears hemodynamically stable Discussed with Dr. Juleen China and he will reevaluate after labs return Final Clinical Impressions(s) / ED Diagnoses   Final diagnoses:  None    ED Discharge Orders    None       Margarita Grizzle, MD 09/09/18 1220

## 2018-09-07 NOTE — ED Notes (Signed)
Pt was trailed off violent restraints and patient starting kicking and screaming at staff. Pt attempted to punch this Rn and Tammy Sours, Charity fundraiser. Security and MD Juleen China was called into the room. Pt was put back in violent restraints and MD notified of need for new order.

## 2018-09-07 NOTE — ED Notes (Signed)
Pt placed in violent restraints 

## 2018-09-07 NOTE — Progress Notes (Signed)
Pt accepted to [facility], Bed 500-1 ONCE PATIENT IS MEDICALLY CLEARED Hillery Jacks, NP is the accepting provider.  Malvin Johns, MD is the attending provider.  Call report to [phone]  [facility] ED notified.   Pt is IVC .  Pt may be transported by [facility] Pt scheduled  to arrive at [facility] once the patient is medically cleared by EDP.  Timmothy Euler. Kaylyn Lim, MSW, LCSW Disposition Clinical Social Work [phone] (cell) [phone] (office)

## 2018-09-07 NOTE — ED Notes (Signed)
Pt belongings in locker #2 

## 2018-09-07 NOTE — H&P (Signed)
Behavioral Health Medical Screening Exam  Allison Woodard is an 28 y.o. female.  Total Time spent with patient: 15 minutes  [name] 28 yo female present with [employer] due to  patient's "bazarr behavior" was reported patient has been using opioid and started talking to herself. Observed responding to internal stimuli. pressures speech and disjointed conservations. patient is pressured displayed paranoid and worsening delusional thoughts. Patient was IVC'd and sent to emergency department for medical clearances.   Psychiatric Specialty Exam: Physical Exam  ROS  Blood pressure (!) 131/104, pulse (!) 115, temperature 98.4 F (36.9 C), temperature source Oral, resp. rate 20, SpO2 91 %.There is no height or weight on file to calculate BMI.  General Appearance: Bizarre and Disheveled  Eye Contact:  Minimal  Speech:  Pressured  Volume:  Increased  Mood:  Anxious, Depressed and Dysphoric  Affect:  Inappropriate and Labile  Thought Process:  Disorganized and Descriptions of Associations: Loose  Orientation:  NA  Thought Content:  Delusions, Hallucinations: Auditory, Paranoid Ideation, Rumination and Tangential  Suicidal Thoughts:  N/A  Homicidal Thoughts:  N/A  Memory:  NA  Judgement:  Impaired  Insight:  Lacking  Psychomotor Activity:  Increased and Restlessness  Concentration: Concentration: Poor  Recall:  Poor  Fund of Knowledge:Poor  Language: Fair  Akathisia:  No  Handed:  Right  AIMS (if indicated):     Assets:  Communication Skills Desire for Improvement Resilience Social Support  Sleep:       Musculoskeletal: Strength & Muscle Tone: within normal limits Gait & Station: normal Patient leans: N/A  Blood pressure (!) 131/104, pulse (!) 115, temperature 98.4 F (36.9 C), temperature source Oral, resp. rate 20, SpO2 91 %.  Recommendations: Inpatient admission after medical clearance  Based on my evaluation the patient appears to have an emergency medical condition for which I  recommend the patient be transferred to the emergency department for further evaluation.  Oneta Rack, NP 09/07/2018, 11:39 AM

## 2018-09-07 NOTE — ED Notes (Signed)
Original IVC paperwork placed in red folder in purple zone

## 2018-09-07 NOTE — ED Notes (Signed)
SPOKE w/ MD PFEIFFER OVER PHONE WHO GAVE VERBAL ORDER TO RESTRAIN PT W/ VIOLENT RESTRAINTS. MD PFEIFFER ALREADY HAS ASSESSED PT IN PERSON.

## 2018-09-08 ENCOUNTER — Encounter (HOSPITAL_COMMUNITY): Payer: Self-pay

## 2018-09-08 ENCOUNTER — Other Ambulatory Visit: Payer: Self-pay

## 2018-09-08 DIAGNOSIS — F15959 Other stimulant use, unspecified with stimulant-induced psychotic disorder, unspecified: Secondary | ICD-10-CM

## 2018-09-08 MED ORDER — RISPERIDONE 2 MG PO TABS
2.0000 mg | ORAL_TABLET | Freq: Two times a day (BID) | ORAL | Status: DC
  Administered 2018-09-08 – 2018-09-09 (×2): 2 mg via ORAL
  Filled 2018-09-08 (×7): qty 1

## 2018-09-08 MED ORDER — TEMAZEPAM 30 MG PO CAPS
30.0000 mg | ORAL_CAPSULE | Freq: Every day | ORAL | Status: DC
  Administered 2018-09-09 – 2018-09-10 (×2): 30 mg via ORAL
  Filled 2018-09-08 (×2): qty 1

## 2018-09-08 MED ORDER — GABAPENTIN 300 MG PO CAPS
300.0000 mg | ORAL_CAPSULE | Freq: Three times a day (TID) | ORAL | Status: DC
  Administered 2018-09-08 – 2018-09-11 (×4): 300 mg via ORAL
  Filled 2018-09-08 (×4): qty 1
  Filled 2018-09-08: qty 21
  Filled 2018-09-08: qty 1
  Filled 2018-09-08: qty 21
  Filled 2018-09-08: qty 1
  Filled 2018-09-08: qty 21
  Filled 2018-09-08 (×6): qty 1

## 2018-09-08 MED ORDER — BENZTROPINE MESYLATE 0.5 MG PO TABS
0.5000 mg | ORAL_TABLET | Freq: Two times a day (BID) | ORAL | Status: DC
  Administered 2018-09-08 – 2018-09-09 (×2): 0.5 mg via ORAL
  Filled 2018-09-08 (×11): qty 1

## 2018-09-08 MED ORDER — LORAZEPAM 2 MG/ML IJ SOLN
2.0000 mg | Freq: Four times a day (QID) | INTRAMUSCULAR | Status: DC
  Administered 2018-09-08 – 2018-09-10 (×2): 2 mg via INTRAMUSCULAR
  Filled 2018-09-08 (×3): qty 1

## 2018-09-08 MED ORDER — LORAZEPAM 1 MG PO TABS
2.0000 mg | ORAL_TABLET | Freq: Four times a day (QID) | ORAL | Status: DC
  Administered 2018-09-08 – 2018-09-11 (×5): 2 mg via ORAL
  Filled 2018-09-08 (×6): qty 2

## 2018-09-08 NOTE — Progress Notes (Signed)
While staff was attending to another patient , Allison Woodard was up behind the nursing station rambling through the drawers. Pt was informed she could not do that that staff could get he what she wanted. Pt continues to present with a oppositional tone and defiant voice.

## 2018-09-08 NOTE — [facility] Suicide Risk Assessment (Signed)
[facility] Admission Suicide Risk Assessment   Nursing information obtained from:  Patient Demographic factors:  Caucasian Current Mental Status:  NA Loss Factors:  NA Historical Factors:  NA Risk Reduction Factors:  NA  Total Time spent with patient: 45 minutes Principal Problem: Amphetamine induced psychotic symptoms and behavior Diagnosis:  Active Problems:   Psychosis (HCC)   Methamphetamine-induced psychotic disorder (HCC)  Subjective Data: Petition for involuntary commitment/continues to be a poor historian/mother phone for collateral information  Continued Clinical Symptoms:  Alcohol Use Disorder Identification Test Final Score (AUDIT): 2 The "Alcohol Use Disorders Identification Test", Guidelines for Use in Primary Care, Second Edition.  World Science writer [facility]). Score between 0-7:  no or low risk or alcohol related problems. Score between 8-15:  moderate risk of alcohol related problems. Score between 16-19:  high risk of alcohol related problems. Score 20 or above:  warrants further diagnostic evaluation for alcohol dependence and treatment.   CLINICAL FACTORS:   Currently Psychotic   Musculoskeletal: Strength & Muscle Tone: spastic Gait & Station: unsteady Patient leans: N/A  Psychiatric Specialty Exam: Physical Exam  Constitutional: She appears well-nourished.  Neck: Neck supple.  Cardiovascular: Normal rate.  Respiratory: Breath sounds normal.    Review of Systems  Constitutional: Negative.   Gastrointestinal: Negative.   Neurological: Negative.   Endo/Heme/Allergies: Negative.     Blood pressure 107/74, pulse 96, temperature (!) 97 F (36.1 C), temperature source Oral, resp. rate 16, height 5\' 2"  (1.575 m), weight 47.6 kg, last menstrual period 08/31/2018.Body mass index is 19.2 kg/m.  General Appearance: Disheveled  Eye Contact:  None  Speech:  Garbled  Volume:  Decreased  Mood:  Dysphoric and Irritable  Affect:  Flat  Thought Process:   Disorganized, Irrelevant and Descriptions of Associations: Circumstantial  Orientation:  Other:  Presumed person place general situation did not answer questions regarding full orientation  Thought Content:  Illogical and Tangential  Suicidal Thoughts:  neg  Homicidal Thoughts:  No  Memory:  Immediate;   Poor  Judgement:  Impaired  Insight:  Lacking  Psychomotor Activity:  Increased  Concentration:  Concentration: Fair  Recall:  Poor  Fund of Knowledge:  Poor  Language:  Poor  Akathisia:  Negative  Handed:  Right  AIMS (if indicated):     Assets:  Leisure Time Physical Health  ADL's:  Intact  Cognition:  WNL  Sleep:  Number of Hours: 5.25      COGNITIVE FEATURES THAT CONTRIBUTE TO RISK:  Loss of executive function    SUICIDE RISK:   Minimal: No identifiable suicidal ideation.  Patients presenting with no risk factors but with morbid ruminations; may be classified as minimal risk based on the severity of the depressive symptoms  PLAN OF CARE: Mid for stabilization/greatest risk to this patient would be a nonintentional overdose  I certify that inpatient services furnished can reasonably be expected to improve the patient's condition.   Malvin Johns, MD 09/08/2018, 9:43 AM

## 2018-09-08 NOTE — Progress Notes (Signed)
Recreation Therapy Notes  Date: 6.2.20 Time: 1000 Location: 500 Hall Dayroom  Group Topic: Wellness  Goal Area(s) Addresses:  Patient will define components of whole wellness. Patient will verbalize benefit of whole wellness.  Intervention:  Exercise, Music   Activity:  Exercise.  LRT led group in a series of stretches.  Each group member given the opportunity to lead the group in an exercise of their choosing.  Patients could take breaks and get water as needed.  Education: Wellness, Building control surveyor.   Education Outcome: Acknowledges education/In group clarification offered/Needs additional education.   Clinical Observations/Feedback: Pt did not attend group session.    Caroll Rancher, LRT/CTRS         Caroll Rancher A 09/08/2018 11:53 AM

## 2018-09-08 NOTE — Progress Notes (Signed)
Admission Note:  28 yr female who presents IVC in no acute distress for the treatment of psychosis. Pt was very confrontational and labile during admission. Pt was sarcastic at times and was answering questions with answers  that did not appear to be the truth , due to pt responses  " I guess, If you say so). Pt constantly would interrupt pt before finishing statement then pt would respond with "I know". Writer tried to explain the IVC process and how her stay here can go, but pt was not receptive at this time. Writer gave pt all admission  information ( ie, code #, unit rules, [facility] process, etc...) pt appeared not to be listening.   A:Skin was assessed and found to be clear of any abnormal marks apart from a tattoo R-side, Bruise R-knee PT searched and no contraband found, POC and unit policies explained and understanding verbalized. Consents obtained. Food and fluids offered, and  accepted.   R: Pt had no additional questions

## 2018-09-08 NOTE — H&P (Signed)
Psychiatric Admission Assessment Adult  Patient Identification: Allison Woodard MRN:  [id number] Date of Evaluation:  09/08/2018 Chief Complaint:  Psychosis Principal Diagnosis: Acute psychosis/disorganized and volatile behaviors on presentation. Diagnosis:  Active Problems:   Psychosis (HCC)   Methamphetamine-induced psychotic disorder (HCC)  History of Present Illness:   This is the first psychiatric admission here, but the second lifetime admission overall as far as we know for this 28 year old patient who presented under petition for involuntary commitment. Though her history is limited as far as her own volunteering of information, she acknowledges a 7372-month binging.  Of methamphetamine usage, stating she uses "as much as she can" this has resulted in a psychotic state. When she presented on 6/1 she was described as delusional, manic, aggressive and required eventually seclusion and restraint.  Mother was phone for collateral information by the assessment team and myself. Patient herself keeps drifting off to sleep and provides minimal and disjointed answers. She does however deny auditory and visual hallucinations and thoughts of harming self or others and does seem to understand those questions temporarily. 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  Mother reports the patient is in fact homeless will sometimes come home to "bother her" but she has not had contact with her other than this in over 6 months.  Further the patient had [place] in [place] area, for 3822-month period of time, the mother is unclear as to what the diagnosis was.  There is also a reference to heroin dependency versus abuse as well as the methamphetamine usage but on this presentation, drug screen only positive for amphetamines.  Associated Signs/Symptoms: Depression Symptoms:  psychomotor agitation, (Hypo) Manic Symptoms:   Delusions, Distractibility, Anxiety Symptoms:  n/a Psychotic Symptoms:  Paranoia, PTSD Symptoms:ukn NA Total Time spent with patient: 45 minutes  Past Psychiatric History: 1 prior admission as far as we know no current psychiatric medications  Is the patient at risk to self? Yes.    Has the patient been a risk to self in the past 6 months? No.  Has the patient been a risk to self within the distant past? No.  Is the patient a risk to others? Yes.    Has the patient been a risk to others in the past 6 months? No.  Has the patient been a risk to others within the distant past? No.   Prior Inpatient Therapy:   Prior Outpatient Therapy:    Alcohol Screening: 1. How often do you have a drink containing alcohol?: Monthly or less 2. How many drinks containing alcohol do you have on a typical day when you are drinking?: 1 or 2 3. How often do you have six or more drinks on one occasion?: Never AUDIT-C Score: 1 4. How often during the last year have you found that you were not able to stop drinking once you had started?: Less than monthly 5. How often during the last year have you failed to do what was normally expected from you becasue of drinking?: Never 6. How often during the last year have you needed a first drink in the morning to get yourself going after a heavy drinking session?: Never 7. How often during the last year have you had a feeling of guilt of remorse after drinking?: Never 8. How often during the last year have you been unable to remember what happened the night before because you had been drinking?: Never 9. Have you or someone  else been injured as a result of your drinking?: No 10. Has a relative or friend or a doctor or another health worker been concerned about your drinking or suggested you cut down?: No Alcohol Use Disorder Identification Test Final Score (AUDIT): 2 Substance Abuse History in the last 12 months:  Yes.   Consequences of Substance Abuse: Medical  Consequences:  see eval Previous Psychotropic Medications: Yes  Psychological Evaluations: No  Past Medical History: History reviewed. No pertinent past medical history. History reviewed. No pertinent surgical history. Family History: History reviewed. No pertinent family history. Family Psychiatric  History: neg- from [place] Tobacco Screening:   Social History:  Social History   Substance and Sexual Activity  Alcohol Use Not Currently     Social History   Substance and Sexual Activity  Drug Use Yes  . Types: Cocaine    Additional Social History:                           Allergies:  No Known Allergies Lab Results:  Results for orders placed or performed during the hospital encounter of 09/07/18 (from the past 48 hour(s))  Comprehensive metabolic panel     Status: Abnormal   Collection Time: 09/07/18 12:58 PM  Result Value Ref Range   Sodium 137 135 - 145 mmol/L   Potassium 3.4 (L) 3.5 - 5.1 mmol/L   Chloride 106 98 - 111 mmol/L   CO2 21 (L) 22 - 32 mmol/L   Glucose, Bld 81 70 - 99 mg/dL   BUN 14 6 - 20 mg/dL   Creatinine, Ser 4.15 0.44 - 1.00 mg/dL   Calcium 8.6 (L) 8.9 - 10.3 mg/dL   Total Protein 7.1 6.5 - 8.1 g/dL   Albumin 3.5 3.5 - 5.0 g/dL   AST 31 15 - 41 U/L   ALT 14 0 - 44 U/L   Alkaline Phosphatase 76 38 - 126 U/L   Total Bilirubin 0.4 0.3 - 1.2 mg/dL   GFR calc non Af Amer >60 >60 mL/min   GFR calc Af Amer >60 >60 mL/min   Anion gap 10 5 - 15    Comment: Performed at [facility], [address] [place], [place] [address]  Ethanol     Status: None   Collection Time: 09/07/18 12:58 PM  Result Value Ref Range   Alcohol, Ethyl (B) <10 <10 mg/dL    Comment: (NOTE) Lowest detectable limit for serum alcohol is 10 mg/dL. For medical purposes only. Performed at [facility], [address], [place], [place] [address]   Salicylate level     Status: None   Collection Time: 09/07/18 12:58 PM  Result Value Ref Range   Salicylate  Lvl <7.0 2.8 - 30.0 mg/dL    Comment: Performed at [facility], [address], [place], [place] [address]  Acetaminophen level     Status: Abnormal   Collection Time: 09/07/18 12:58 PM  Result Value Ref Range   Acetaminophen (Tylenol), Serum <10 (L) 10 - 30 ug/mL    Comment: (NOTE) Therapeutic concentrations vary significantly. A range of 10-30 ug/mL  may be an effective concentration for many patients. However, some  are best treated at concentrations outside of this range. Acetaminophen concentrations >150 ug/mL at 4 hours after ingestion  and >50 ug/mL at 12 hours after ingestion are often associated with  toxic reactions. Performed at [facility], [address] [place],  [place] [address]   CBC with Differential     Status: None   Collection Time: 09/07/18 12:58 PM  Result Value Ref Range   WBC 4.2 4.0 - 10.5 K/uL   RBC 4.47 3.87 - 5.11 MIL/uL   Hemoglobin 12.5 12.0 - 15.0 g/dL   HCT 60.4 54.0 - 98.1 %   MCV 87.7 80.0 - 100.0 fL   MCH 28.0 26.0 - 34.0 pg   MCHC 31.9 30.0 - 36.0 g/dL   RDW 19.1 47.8 - 29.5 %   Platelets 274 150 - 400 K/uL   nRBC 0.0 0.0 - 0.2 %   Neutrophils Relative % 56 %   Neutro Abs 2.3 1.7 - 7.7 K/uL   Lymphocytes Relative 28 %   Lymphs Abs 1.2 0.7 - 4.0 K/uL   Monocytes Relative 15 %   Monocytes Absolute 0.6 0.1 - 1.0 K/uL   Eosinophils Relative 0 %   Eosinophils Absolute 0.0 0.0 - 0.5 K/uL   Basophils Relative 1 %   Basophils Absolute 0.0 0.0 - 0.1 K/uL   Immature Granulocytes 0 %   Abs Immature Granulocytes 0.01 0.00 - 0.07 K/uL    Comment: Performed at [facility], [address], [place], [place] [address]  Protime-INR     Status: None   Collection Time: 09/07/18 12:58 PM  Result Value Ref Range   Prothrombin Time 14.4 11.4 - 15.2 seconds   INR 1.1 0.8 - 1.2    Comment: (NOTE) INR goal varies based on device and disease states. Performed at [facility], [address] [place], [place] [address]   TSH      Status: None   Collection Time: 09/07/18 12:58 PM  Result Value Ref Range   TSH 0.673 0.350 - 4.500 uIU/mL    Comment: Performed by a 3rd Generation assay with a functional sensitivity of <=0.01 uIU/mL. Performed at [facility], [address] [place], [place] [address]   I-Stat beta hCG blood, ED     Status: None   Collection Time: 09/07/18  3:25 PM  Result Value Ref Range   I-stat hCG, quantitative <5.0 <5 mIU/mL   Comment 3            Comment:   GEST. AGE      CONC.  (mIU/mL)   <=1 WEEK        5 - 50     2 WEEKS       50 - 500     3 WEEKS       100 - 10,000     4 WEEKS     1,000 - 30,000        FEMALE AND NON-PREGNANT FEMALE:     LESS THAN 5 mIU/mL   Urinalysis, Routine w reflex microscopic     Status: Abnormal   Collection Time: 09/07/18  4:30 PM  Result Value Ref Range   Color, Urine YELLOW YELLOW   APPearance HAZY (A) CLEAR   Specific Gravity, Urine 1.033 (H) 1.005 - 1.030   pH 5.0 5.0 - 8.0   Glucose, UA NEGATIVE NEGATIVE mg/dL   Hgb urine dipstick MODERATE (A) NEGATIVE   Bilirubin Urine NEGATIVE NEGATIVE   Ketones, ur 20 (A) NEGATIVE mg/dL   Protein, ur NEGATIVE NEGATIVE mg/dL   Nitrite NEGATIVE NEGATIVE   Leukocytes,Ua NEGATIVE NEGATIVE   RBC / HPF >50 (H) 0 - 5 RBC/hpf   WBC, UA 0-5 0 - 5 WBC/hpf   Bacteria, UA  RARE (A) NONE SEEN   Squamous Epithelial / LPF 0-5 0 - 5   Mucus PRESENT     Comment: Performed at [facility], [address], [place], [place] [address]  Urine rapid drug screen (hosp performed)     Status: Abnormal   Collection Time: 09/07/18  4:30 PM  Result Value Ref Range   Opiates NONE DETECTED NONE DETECTED   Cocaine NONE DETECTED NONE DETECTED   Benzodiazepines NONE DETECTED NONE DETECTED   Amphetamines POSITIVE (A) NONE DETECTED   Tetrahydrocannabinol NONE DETECTED NONE DETECTED   Barbiturates NONE DETECTED NONE DETECTED    Comment: (NOTE) DRUG SCREEN FOR MEDICAL PURPOSES ONLY.  IF CONFIRMATION IS NEEDED FOR ANY PURPOSE,  NOTIFY LAB WITHIN 5 DAYS. LOWEST DETECTABLE LIMITS FOR URINE DRUG SCREEN Drug Class                     Cutoff (ng/mL) Amphetamine and metabolites    1000 Barbiturate and metabolites    200 Benzodiazepine                 200 Tricyclics and metabolites     300 Opiates and metabolites        300 Cocaine and metabolites        300 THC                            50 Performed at [facility], [address], [place], [place] [address]   SARS Coronavirus 2 (CEPHEID - Performed in [facility] hospital lab), Hosp Order     Status: None   Collection Time: 09/07/18  7:27 PM  Result Value Ref Range   SARS Coronavirus 2 NEGATIVE NEGATIVE    Comment: (NOTE) If result is NEGATIVE SARS-CoV-2 target nucleic acids are NOT DETECTED. The SARS-CoV-2 RNA is generally detectable in upper and lower  respiratory specimens during the acute phase of infection. The lowest  concentration of SARS-CoV-2 viral copies this assay can detect is 250  copies / mL. A negative result does not preclude SARS-CoV-2 infection  and should not be used as the sole basis for treatment or other  patient management decisions.  A negative result may occur with  improper specimen collection / handling, submission of specimen other  than nasopharyngeal swab, presence of viral mutation(s) within the  areas targeted by this assay, and inadequate number of viral copies  (<250 copies / mL). A negative result must be combined with clinical  observations, patient history, and epidemiological information. If result is POSITIVE SARS-CoV-2 target nucleic acids are DETECTED. The SARS-CoV-2 RNA is generally detectable in upper and lower  respiratory specimens dur ing the acute phase of infection.  Positive  results are indicative of active infection with SARS-CoV-2.  Clinical  correlation with patient history and other diagnostic information is  necessary to determine patient infection status.  Positive results do  not rule out  bacterial infection or co-infection with other viruses. If result is PRESUMPTIVE POSTIVE SARS-CoV-2 nucleic acids MAY BE PRESENT.   A presumptive positive result was obtained on the submitted specimen  and confirmed on repeat testing.  While 2019 novel coronavirus  (SARS-CoV-2) nucleic acids may be present in the submitted sample  additional confirmatory testing may be necessary for epidemiological  and / or clinical management purposes  to differentiate between  SARS-CoV-2 and other Sarbecovirus currently known to infect humans.  If clinically indicated additional testing  with an alternate test  methodology [phone]) is advised. The SARS-CoV-2 RNA is generally  detectable in upper and lower respiratory sp ecimens during the acute  phase of infection. The expected result is Negative. Fact Sheet for Patients:  BoilerBrush.com.cy Fact Sheet for Healthcare Providers: https://pope.com/ This test is not yet approved or cleared by the [place] FDA and has been authorized for detection and/or diagnosis of SARS-CoV-2 by FDA under an Emergency Use Authorization (EUA).  This EUA will remain in effect (meaning this test can be used) for the duration of the COVID-19 declaration under Section 564(b)(1) of the Act, 21 U.S.C. section 360bbb-3(b)(1), unless the authorization is terminated or revoked sooner. Performed at [facility], [address], [place], [place] [address]     Blood Alcohol level:  Lab Results  Component Value Date   ETH <10 09/07/2018   ETH <10 06/17/2018    Metabolic Disorder Labs:  No results found for: HGBA1C, MPG No results found for: PROLACTIN No results found for: CHOL, TRIG, HDL, CHOLHDL, VLDL, LDLCALC  Current Medications: Current Facility-Administered Medications  Medication Dose Route Frequency Provider Last Rate Last Dose  . acetaminophen (TYLENOL) tablet 650 mg  650 mg Oral Q6H PRN Oneta Rack,  NP      . alum & mag hydroxide-simeth (MAALOX/MYLANTA) 200-200-20 MG/5ML suspension 30 mL  30 mL Oral Q4H PRN Oneta Rack, NP      . benztropine (COGENTIN) tablet 0.5 mg  0.5 mg Oral BID Malvin Johns, MD      . gabapentin (NEURONTIN) capsule 300 mg  300 mg Oral TID Malvin Johns, MD      . LORazepam (ATIVAN) tablet 2 mg  2 mg Oral Q6H Malvin Johns, MD       Or  . LORazepam (ATIVAN) injection 2 mg  2 mg Intramuscular Q6H Malvin Johns, MD      . magnesium hydroxide (MILK OF MAGNESIA) suspension 30 mL  30 mL Oral Daily PRN Oneta Rack, NP      . risperiDONE (RISPERDAL) tablet 2 mg  2 mg Oral BID Malvin Johns, MD      . temazepam (RESTORIL) capsule 30 mg  30 mg Oral QHS Malvin Johns, MD      . traZODone (DESYREL) tablet 50 mg  50 mg Oral QHS,MR X 1 Jackelyn Poling, NP   Stopped at 09/08/18 0103   PTA Medications: No medications prior to admission.    Musculoskeletal: Strength & Muscle Tone: spastic Gait & Station: unsteady Patient leans: N/A  Psychiatric Specialty Exam: Physical Exam  Constitutional: She appears well-nourished.  Neck: Neck supple.  Cardiovascular: Normal rate.  Respiratory: Breath sounds normal.    Review of Systems  Constitutional: Negative.   Gastrointestinal: Negative.   Neurological: Negative.   Endo/Heme/Allergies: Negative.     Blood pressure 107/74, pulse 96, temperature (!) 97 F (36.1 C), temperature source Oral, resp. rate 16, height  (1.575 m), weight 47.6 kg, last menstrual period 08/31/2018.Body mass index is 19.2 kg/m.  General Appearance: Disheveled  Eye Contact:  None  Speech:  Garbled  Volume:  Decreased  Mood:  Dysphoric and Irritable  Affect:  Flat  Thought Process:  Disorganized, Irrelevant and Descriptions of Associations: Circumstantial  Orientation:  Other:  Presumed person place general situation did not answer questions regarding full orientation  Thought Content:  Illogical and Tangential  Suicidal Thoughts:  neg   Homicidal Thoughts:  No  Memory:  Immediate;   Poor  Judgement:  Impaired  Insight:  Lacking  Psychomotor Activity:  Increased  Concentration:  Concentration: Fair  Recall:  Poor  Fund of Knowledge:  Poor  Language:  Poor  Akathisia:  Negative  Handed:  Right  AIMS (if indicated):     Assets:  Leisure Time Physical Health  ADL's:  Intact  Cognition:  WNL  Sleep:  Number of Hours: 5.25      Treatment Plan Summary: Daily contact with patient to assess and evaluate symptoms and progress in treatment and Medication management  Observation Level/Precautions:  15 minute checks  Laboratory:  UDS  Psychotherapy: Reality based therapy  Medications: Begin antipsychotic therapy  Consultations:  n/a  Discharge Concerns:    Estimated LOS:5 day  Other:    1-methamphetamine do psychosis/methamphetamine dependence/history of opiate dependency Axis II defer Axis III no acute medical findings other than drug-induced akathisia/spasticity Axis IV-substance abuse/homelessness  Axis V 20 high this year 50   Physician Treatment Plan for Primary Diagnosis: <principal problem not specified> Long Term Goal(s): Improvement in symptoms so as ready for discharge  Short Term Goals: Ability to identify and develop effective coping behaviors will improve and Ability to maintain clinical measurements within normal limits will improve  Physician Treatment Plan for Secondary Diagnosis: Active Problems:   Psychosis (HCC)   Methamphetamine-induced psychotic disorder (HCC)  Long Term Goal(s): Improvement in symptoms so as ready for discharge  Short Term Goals: Ability to identify and develop effective coping behaviors will improve and Ability to maintain clinical measurements within normal limits will improve  I certify that inpatient services furnished can reasonably be expected to improve the patient's condition.    Malvin Johns, MD 6/2/20209:49 AM

## 2018-09-08 NOTE — Plan of Care (Signed)
Staff attempted to talk to patient today, to take medications, to use coping skills, but patient refused.

## 2018-09-08 NOTE — Tx Team (Signed)
Initial Treatment Plan 09/08/2018 1:06 AM Harlow Asa EML:[id number]    PATIENT STRESSORS: Medication change or noncompliance Substance abuse   PATIENT STRENGTHS: General fund of knowledge Supportive family/friends   PATIENT IDENTIFIED PROBLEMS:  Psychosis  "nothing wrong with me"                   DISCHARGE CRITERIA:  Improved stabilization in mood, thinking, and/or behavior Verbal commitment to aftercare and medication compliance  PRELIMINARY DISCHARGE PLAN: Attend aftercare/continuing care group Attend PHP/IOP Outpatient therapy  PATIENT/FAMILY INVOLVEMENT: This treatment plan has been presented to and reviewed with the patient, Allison Woodard.  The patient and family have been given the opportunity to ask questions and make suggestions.  Delos Haring, RN 09/08/2018, 1:06 AM

## 2018-09-08 NOTE — Progress Notes (Signed)
D: Pt been in room appeared sleep. When interaction tried to be made, pt ignored Clinical research associate.   A: Pt was offered support and encouragement.  Pt was encourage to attend groups. Q 15 minute checks were done for safety.   R: safety maintained on unit.

## 2018-09-08 NOTE — Progress Notes (Signed)
Patient has been laying in bed today, sleeping at times.  When staff comes into room to talk to patient, patient smiles, ignores staff, rolls from side to side in bed.  Patient did take oral medications this morning and at lunch time.  Patient refused oral medications at dinner time.  Patient continued to push nurse's hand away.  Patient was told to take p.o. meds or an IM ativan would be given.  Patient continued to refuse p.o. meds.  Ativan IM 2 mg given patient.  Patient closed her eyes and refused to talk to staff. Respirations even and unlabored.  No signs/symptoms of pain/distress noted on patient's face/body movements.   Safety maintained with 15 minute checks.

## 2018-09-09 NOTE — Progress Notes (Signed)
Pt walked over to the dirty clothes hamper ad put trash in it, pt was informed to take the trash ou" If you give me some gloves I will do it " pt continues to be oppositional and defiant towards staff

## 2018-09-09 NOTE — [facility] Group Notes (Signed)
Pt was invited but did not attend orientation/goals group. 

## 2018-09-09 NOTE — Tx Team (Signed)
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 acetaminophen (TYLENOL) tablet 650 mg  650 mg Oral Q6H PRN Derrill Center, NP      . alum & mag hydroxide-simeth (MAALOX/MYLANTA) 200-200-20 MG/5ML suspension 30 mL  30 mL Oral Q4H PRN Derrill Center, NP      . benztropine (COGENTIN) tablet 0.5 mg  0.5 mg Oral BID Johnn Hai, MD   0.5 mg at 09/09/18 0755  . gabapentin (NEURONTIN) capsule 300 mg  300 mg Oral TID Johnn Hai, MD   300 mg at 09/09/18 1207  . LORazepam (ATIVAN) tablet 2 mg  2 mg Oral Q6H Johnn Hai, MD   2 mg at 09/09/18 1207   Or  . LORazepam (ATIVAN) injection 2 mg  2 mg Intramuscular Q6H Johnn Hai, MD   2 mg at 09/08/18 1655  . magnesium hydroxide (MILK OF MAGNESIA) suspension 30 mL  30 mL Oral Daily PRN Derrill Center, NP      . risperiDONE (RISPERDAL) tablet 2 mg  2 mg Oral BID Johnn Hai, MD   2 mg at 09/09/18 0755  . temazepam (RESTORIL) capsule 30 mg  30 mg Oral QHS Johnn Hai, MD      . traZODone (DESYREL) tablet 50 mg  50 mg Oral QHS,MR X 1 Rozetta Nunnery, NP   Stopped at 09/08/18 0103   PTA Medications: No medications prior to admission.    Patient Stressors: Medication change or noncompliance Substance abuse  Patient Strengths: General fund of knowledge Supportive family/friends  Treatment Modalities: Medication Management, Group therapy, Case management,  1 to 1 session with clinician, Psychoeducation, Recreational therapy.   Physician Treatment Plan for Primary Diagnosis: <principal problem not specified> Long Term Goal(s): Improvement in symptoms so as ready for  discharge Improvement in symptoms so as ready for discharge   Short Term Goals: Ability to identify and develop effective coping behaviors will improve Ability to maintain clinical measurements within normal limits will improve Ability to identify and develop effective coping behaviors will improve Ability to maintain clinical measurements within normal limits will improve  Medication Management: Evaluate patient's response, side effects, and tolerance of medication regimen.  Therapeutic Interventions: 1 to 1 sessions, Unit Group sessions and Medication administration.  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  Therapeutic Interventions: 1 to 1 sessions, Unit Group sessions and Medication administration.  Evaluation of Outcomes: Not Met   RN Treatment Plan for Primary Diagnosis: <principal problem not specified> Long Term Goal(s): Knowledge of disease and therapeutic regimen to maintain health will improve  Short Term Goals: Ability to identify and develop effective coping behaviors will improve  and Compliance with prescribed medications will improve  Medication Management: RN will administer medications as ordered by provider, will assess and evaluate patient's response and provide education to patient for prescribed  medication. RN will report any adverse and/or side effects to prescribing provider.  Therapeutic Interventions: 1 on 1 counseling sessions, Psychoeducation, Medication administration, Evaluate responses to treatment, Monitor vital signs and CBGs as ordered, Perform/monitor CIWA, COWS, AIMS and Fall Risk screenings as ordered, Perform wound care treatments as ordered.  Evaluation of Outcomes: Not Met   LCSW Treatment Plan for Primary Diagnosis: <principal problem not specified> Long Term Goal(s): Safe transition to appropriate next level of care at discharge, Engage patient in therapeutic group addressing interpersonal concerns.  Short Term Goals: Engage patient in aftercare planning with referrals and resources, Increase social support and Increase skills for wellness and recovery  Therapeutic Interventions: Assess for all discharge needs, 1 to 1 time with Social worker, Explore available resources and support systems, Assess for adequacy in community support network, Educate family and significant other(s) on suicide prevention, Complete Psychosocial Assessment, Interpersonal group therapy.  Evaluation of Outcomes: Not Met   Progress in Treatment: Attending groups: No. Participating in groups: No. Taking medication as prescribed: Yes. Toleration medication: Yes. Family/Significant other contact made: No, will contact:  when given permission Patient understands diagnosis: Yes. Discussing patient identified problems/goals with staff: Yes. Medical problems stabilized or resolved: Yes. Denies suicidal/homicidal ideation: Yes. Issues/concerns per patient self-inventory: No. Other: none  New problem(s) identified: No, Describe:  none  New Short Term/Long Term Goal(s):  Patient Goals:    Discharge Plan or Barriers:   Reason for Continuation of Hospitalization: Delusions  Medication stabilization  Estimated Length of Stay:3-5 days.  Attendees: Patient: 09/09/2018   Physician: Dr.  Jake Samples, MD 09/09/2018   Nursing: Elesa Massed, RN 09/09/2018   RN Care Manager: 09/09/2018   Social Worker: Lurline Idol, LCSW 09/09/2018   Recreational Therapist:  09/09/2018   Other:  09/09/2018   Other:  09/09/2018   Other: 09/09/2018     Scribe for Treatment Team: Joanne Chars, [place] 09/09/2018 2:23 PM

## 2018-09-09 NOTE — Progress Notes (Signed)
Recreation Therapy Notes  Date:  6.3.20 Time: 1000 Location: 500 Hall Dayroom  Group Topic: Stress Management  Goal Area(s) Addresses:  Patient will identify factors that contribute to stress. Patient will identify factors that protect against stress.  Intervention:  Worksheet, pencils  Activity :  Stress Exploration.  Patients were to identify daily hassles, major life changes and life circumstances.  Patients were to then identify daily uplifts, healthy coping strategies and protective factors.    Education:  Stress Management, Discharge Planning.   Education Outcome: Acknowledges Education  Clinical Observations/Feedback: Pt did not attend group.    Markey Deady, LRT/CTRS         Allison Woodard 09/09/2018 12:11 PM 

## 2018-09-09 NOTE — Progress Notes (Signed)
[facility] MD Progress Note  09/09/2018 8:52 AM Allison Woodard  MRN:  [id number] Subjective:   Patient continues to be disorganized in general she has refused the majority of her medications and required IM lorazepam last evening. When she is aroused she picks at the air and asks for "ice cream" and she is mumbling and making disjointed statements some of them unintelligible. She is oriented though she knows that she is hospitalized at [facility] does not elaborate on day date or time. Ignore some examiners according to notes. Mother's history indicates patient's been homeless for some time intermittently abusing compounds and again has presented with a methamphetamine induced psychosis that is only partially resolved at present, may have induced a more permanent psychosis.  Compliance is encouraged Principal Problem: Methamphetamine induced psychosis, rule out permanence of this condition Diagnosis: Active Problems:   Psychosis ([facility])   Methamphetamine-induced psychotic disorder ([facility])  Total Time spent with patient: 20 minutes  Past Psychiatric History: Not elaborated  Past Medical History: History reviewed. No pertinent past medical history. History reviewed. No pertinent surgical history. Family History: History reviewed. No pertinent family history. Family Psychiatric  History: Unknown to patient Social History:  Social History   Substance and Sexual Activity  Alcohol Use Not Currently     Social History   Substance and Sexual Activity  Drug Use Yes  . Types: Cocaine    Social History   Socioeconomic History  . Marital status: Single    Spouse name: Not on file  . Number of children: Not on file  . Years of education: Not on file  . Highest education level: Not on file  Occupational History  . Not on file  Social Needs  . Financial resource strain: Not on file  . Food insecurity:    Worry: Not on file    Inability: Not on file  . Transportation needs:    Medical: Not on file     Non-medical: Not on file  Tobacco Use  . Smoking status: Current Every Day Smoker  . Smokeless tobacco: Current User  Substance and Sexual Activity  . Alcohol use: Not Currently  . Drug use: Yes    Types: Cocaine  . Sexual activity: Not on file  Lifestyle  . Physical activity:    Days per week: Not on file    Minutes per session: Not on file  . Stress: Not on file  Relationships  . Social connections:    Talks on phone: Not on file    Gets together: Not on file    Attends religious service: Not on file    Active member of club or organization: Not on file    Attends meetings of clubs or organizations: Not on file    Relationship status: Not on file  Other Topics Concern  . Not on file  Social History Narrative  . Not on file   Additional Social History:                         Sleep: Fair  Appetite:  Fair  Current Medications: Current Facility-Administered Medications  Medication Dose Route Frequency Provider Last Rate Last Dose  . acetaminophen (TYLENOL) tablet 650 mg  650 mg Oral Q6H PRN Oneta Rack, NP      . alum & mag hydroxide-simeth (MAALOX/MYLANTA) 200-200-20 MG/5ML suspension 30 mL  30 mL Oral Q4H PRN Oneta Rack, NP      . benztropine (COGENTIN) tablet 0.5 mg  0.5 mg Oral BID Malvin Johns, MD   0.5 mg at 09/09/18 0755  . gabapentin (NEURONTIN) capsule 300 mg  300 mg Oral TID Malvin Johns, MD   300 mg at 09/09/18 0756  . LORazepam (ATIVAN) tablet 2 mg  2 mg Oral Q6H Malvin Johns, MD   2 mg at 09/08/18 1030   Or  . LORazepam (ATIVAN) injection 2 mg  2 mg Intramuscular Q6H Malvin Johns, MD   2 mg at 09/08/18 1655  . magnesium hydroxide (MILK OF MAGNESIA) suspension 30 mL  30 mL Oral Daily PRN Oneta Rack, NP      . risperiDONE (RISPERDAL) tablet 2 mg  2 mg Oral BID Malvin Johns, MD   2 mg at 09/09/18 0755  . temazepam (RESTORIL) capsule 30 mg  30 mg Oral QHS Malvin Johns, MD      . traZODone (DESYREL) tablet 50 mg  50 mg Oral QHS,MR X 1  Jackelyn Poling, NP   Stopped at 09/08/18 0103    Lab Results:  Results for orders placed or performed during the hospital encounter of 09/07/18 (from the past 48 hour(s))  Comprehensive metabolic panel     Status: Abnormal   Collection Time: 09/07/18 12:58 PM  Result Value Ref Range   Sodium 137 135 - 145 mmol/L   Potassium 3.4 (L) 3.5 - 5.1 mmol/L   Chloride 106 98 - 111 mmol/L   CO2 21 (L) 22 - 32 mmol/L   Glucose, Bld 81 70 - 99 mg/dL   BUN 14 6 - 20 mg/dL   Creatinine, Ser 1.61 0.44 - 1.00 mg/dL   Calcium 8.6 (L) 8.9 - 10.3 mg/dL   Total Protein 7.1 6.5 - 8.1 g/dL   Albumin 3.5 3.5 - 5.0 g/dL   AST 31 15 - 41 U/L   ALT 14 0 - 44 U/L   Alkaline Phosphatase 76 38 - 126 U/L   Total Bilirubin 0.4 0.3 - 1.2 mg/dL   GFR calc non Af Amer >60 >60 mL/min   GFR calc Af Amer >60 >60 mL/min   Anion gap 10 5 - 15    Comment: Performed at [facility], [address], [place], [place] [address]  Ethanol     Status: None   Collection Time: 09/07/18 12:58 PM  Result Value Ref Range   Alcohol, Ethyl (B) <10 <10 mg/dL    Comment: (NOTE) Lowest detectable limit for serum alcohol is 10 mg/dL. For medical purposes only. Performed at [facility], [address], [place], [place] [address]   Salicylate level     Status: None   Collection Time: 09/07/18 12:58 PM  Result Value Ref Range   Salicylate Lvl <7.0 2.8 - 30.0 mg/dL    Comment: Performed at [facility], [address] [place], [place] [address]  Acetaminophen level     Status: Abnormal   Collection Time: 09/07/18 12:58 PM  Result Value Ref Range   Acetaminophen (Tylenol), Serum <10 (L) 10 - 30 ug/mL    Comment: (NOTE) Therapeutic concentrations vary significantly. A range of 10-30 ug/mL  may be an effective concentration for many patients. However, some  are best treated at concentrations outside of this range. Acetaminophen concentrations >150 ug/mL at 4 hours after ingestion  and >50 ug/mL at 12 hours  after ingestion are often associated with  toxic reactions. Performed at [facility], [address], [place], [place] [address]   CBC with Differential  Status: None   Collection Time: 09/07/18 12:58 PM  Result Value Ref Range   WBC 4.2 4.0 - 10.5 K/uL   RBC 4.47 3.87 - 5.11 MIL/uL   Hemoglobin 12.5 12.0 - 15.0 g/dL   HCT 16.1 09.6 - 04.5 %   MCV 87.7 80.0 - 100.0 fL   MCH 28.0 26.0 - 34.0 pg   MCHC 31.9 30.0 - 36.0 g/dL   RDW 40.9 81.1 - 91.4 %   Platelets 274 150 - 400 K/uL   nRBC 0.0 0.0 - 0.2 %   Neutrophils Relative % 56 %   Neutro Abs 2.3 1.7 - 7.7 K/uL   Lymphocytes Relative 28 %   Lymphs Abs 1.2 0.7 - 4.0 K/uL   Monocytes Relative 15 %   Monocytes Absolute 0.6 0.1 - 1.0 K/uL   Eosinophils Relative 0 %   Eosinophils Absolute 0.0 0.0 - 0.5 K/uL   Basophils Relative 1 %   Basophils Absolute 0.0 0.0 - 0.1 K/uL   Immature Granulocytes 0 %   Abs Immature Granulocytes 0.01 0.00 - 0.07 K/uL    Comment: Performed at [facility], [address], [place], [place] [address]  Protime-INR     Status: None   Collection Time: 09/07/18 12:58 PM  Result Value Ref Range   Prothrombin Time 14.4 11.4 - 15.2 seconds   INR 1.1 0.8 - 1.2    Comment: (NOTE) INR goal varies based on device and disease states. Performed at [facility], [address], [place], [place] [address]   TSH     Status: None   Collection Time: 09/07/18 12:58 PM  Result Value Ref Range   TSH 0.673 0.350 - 4.500 uIU/mL    Comment: Performed by a 3rd Generation assay with a functional sensitivity of <=0.01 uIU/mL. Performed at [facility], [address] [place], [place] [address]   I-Stat beta hCG blood, ED     Status: None   Collection Time: 09/07/18  3:25 PM  Result Value Ref Range   I-stat hCG, quantitative <5.0 <5 mIU/mL   Comment 3            Comment:   GEST. AGE      CONC.  (mIU/mL)   <=1 WEEK        5 - 50     2 WEEKS       50 - 500     3 WEEKS       100 - 10,000      4 WEEKS     1,000 - 30,000        FEMALE AND NON-PREGNANT FEMALE:     LESS THAN 5 mIU/mL   Urinalysis, Routine w reflex microscopic     Status: Abnormal   Collection Time: 09/07/18  4:30 PM  Result Value Ref Range   Color, Urine YELLOW YELLOW   APPearance HAZY (A) CLEAR   Specific Gravity, Urine 1.033 (H) 1.005 - 1.030   pH 5.0 5.0 - 8.0   Glucose, UA NEGATIVE NEGATIVE mg/dL   Hgb urine dipstick MODERATE (A) NEGATIVE   Bilirubin Urine NEGATIVE NEGATIVE   Ketones, ur 20 (A) NEGATIVE mg/dL   Protein, ur NEGATIVE NEGATIVE mg/dL   Nitrite NEGATIVE NEGATIVE   Leukocytes,Ua NEGATIVE NEGATIVE   RBC / HPF >50 (H) 0 - 5 RBC/hpf   WBC, UA 0-5 0 - 5 WBC/hpf   Bacteria, UA RARE (A) NONE SEEN   Squamous Epithelial / LPF 0-5  0 - 5   Mucus PRESENT     Comment: Performed at [facility], [address] [place], [place] [address]  Urine rapid drug screen (hosp performed)     Status: Abnormal   Collection Time: 09/07/18  4:30 PM  Result Value Ref Range   Opiates NONE DETECTED NONE DETECTED   Cocaine NONE DETECTED NONE DETECTED   Benzodiazepines NONE DETECTED NONE DETECTED   Amphetamines POSITIVE (A) NONE DETECTED   Tetrahydrocannabinol NONE DETECTED NONE DETECTED   Barbiturates NONE DETECTED NONE DETECTED    Comment: (NOTE) DRUG SCREEN FOR MEDICAL PURPOSES ONLY.  IF CONFIRMATION IS NEEDED FOR ANY PURPOSE, NOTIFY LAB WITHIN 5 DAYS. LOWEST DETECTABLE LIMITS FOR URINE DRUG SCREEN Drug Class                     Cutoff (ng/mL) Amphetamine and metabolites    1000 Barbiturate and metabolites    200 Benzodiazepine                 200 Tricyclics and metabolites     300 Opiates and metabolites        300 Cocaine and metabolites        300 THC                            50 Performed at [facility], [address] [place], [place] [address]   SARS Coronavirus 2 (CEPHEID - Performed in [facility] hospital lab), Hosp Order     Status: None   Collection Time: 09/07/18  7:27 PM   Result Value Ref Range   SARS Coronavirus 2 NEGATIVE NEGATIVE    Comment: (NOTE) If result is NEGATIVE SARS-CoV-2 target nucleic acids are NOT DETECTED. The SARS-CoV-2 RNA is generally detectable in upper and lower  respiratory specimens during the acute phase of infection. The lowest  concentration of SARS-CoV-2 viral copies this assay can detect is 250  copies / mL. A negative result does not preclude SARS-CoV-2 infection  and should not be used as the sole basis for treatment or other  patient management decisions.  A negative result may occur with  improper specimen collection / handling, submission of specimen other  than nasopharyngeal swab, presence of viral mutation(s) within the  areas targeted by this assay, and inadequate number of viral copies  (<250 copies / mL). A negative result must be combined with clinical  observations, patient history, and epidemiological information. If result is POSITIVE SARS-CoV-2 target nucleic acids are DETECTED. The SARS-CoV-2 RNA is generally detectable in upper and lower  respiratory specimens dur ing the acute phase of infection.  Positive  results are indicative of active infection with SARS-CoV-2.  Clinical  correlation with patient history and other diagnostic information is  necessary to determine patient infection status.  Positive results do  not rule out bacterial infection or co-infection with other viruses. If result is PRESUMPTIVE POSTIVE SARS-CoV-2 nucleic acids MAY BE PRESENT.   A presumptive positive result was obtained on the submitted specimen  and confirmed on repeat testing.  While 2019 novel coronavirus  (SARS-CoV-2) nucleic acids may be present in the submitted sample  additional confirmatory testing may be necessary for epidemiological  and / or clinical management purposes  to differentiate between  SARS-CoV-2 and other Sarbecovirus currently known to infect humans.  If clinically indicated additional testing with  an alternate test  methodology [phone] is advised. The SARS-CoV-2  RNA is generally  detectable in upper and lower respiratory sp ecimens during the acute  phase of infection. The expected result is Negative. Fact Sheet for Patients:  BoilerBrush.com.cy Fact Sheet for Healthcare Providers: https://pope.com/ This test is not yet approved or cleared by the [place] FDA and has been authorized for detection and/or diagnosis of SARS-CoV-2 by FDA under an Emergency Use Authorization (EUA).  This EUA will remain in effect (meaning this test can be used) for the duration of the COVID-19 declaration under Section 564(b)(1) of the Act, 21 U.S.C. section 360bbb-3(b)(1), unless the authorization is terminated or revoked sooner. Performed at [facility], [address], [place], [place] [address]     Blood Alcohol level:  Lab Results  Component Value Date   ETH <10 09/07/2018   ETH <10 06/17/2018    Metabolic Disorder Labs: No results found for: HGBA1C, MPG No results found for: PROLACTIN No results found for: CHOL, TRIG, HDL, CHOLHDL, VLDL, LDLCALC  Physical Findings: AIMS: Facial and Oral Movements Muscles of Facial Expression: None, normal Lips and Perioral Area: None, normal Jaw: None, normal Tongue: None, normal,Extremity Movements Upper (arms, wrists, hands, fingers): None, normal Lower (legs, knees, ankles, toes): None, normal, Trunk Movements Neck, shoulders, hips: None, normal, Overall Severity Severity of abnormal movements (highest score from questions above): None, normal Incapacitation due to abnormal movements: None, normal Patient's awareness of abnormal movements (rate only patient's report): No Awareness, Dental Status Current problems with teeth and/or dentures?: No Does patient usually wear dentures?: No  CIWA:  CIWA-Ar Total: 0 COWS:  COWS Total Score: 1  Musculoskeletal: Strength & Muscle Tone:  spastic Gait & Station: unsteady Patient leans: N/A  Psychiatric Specialty Exam: Physical Exam  ROS  Blood pressure 110/76, pulse 80, temperature 98.1 F (36.7 C), temperature source Oral, resp. rate 16, height  (1.575 m), weight 47.6 kg, last menstrual period 08/31/2018.Body mass index is 19.2 kg/m.  General Appearance: Disheveled  Eye Contact:  None  Speech:  Garbled and Slurred  Volume:  Decreased  Mood:  Dysphoric and Irritable  Affect:  Constricted  Thought Process:  Disorganized and Descriptions of Associations: Loose  Orientation: Person and place  Thought Content:  Illogical and Tangential  Suicidal Thoughts:  No  Homicidal Thoughts:  No  Memory:  Immediate;   poor  Judgement:  Impaired  Insight:  Lacking  Psychomotor Activity:  Restlessness  Concentration:  Concentration: Poor  Recall:  Poor  Fund of Knowledge:  Poor  Language:  Poor  Akathisia:  Yes  Handed:  Right  AIMS (if indicated):     Assets:  Leisure Time Physical Health  ADL's:  Intact  Cognition:  WNL  Sleep:  Number of Hours: 6.75     Treatment Plan Summary: Daily contact with patient to assess and evaluate symptoms and progress in treatment, Medication management and Plan Continue to encourage compliance and use.  Lorazepam when needed continue reality-based therapy continue to stress the importance of abstinence, no change in treatment plans or precautions at present  [facility], MD 09/09/2018, 8:52 AM

## 2018-09-09 NOTE — Progress Notes (Signed)
Pt rude , disrespectful and verbally aggressive. Pt came to the medication window" WHEN AM I LEAVING" . Pt was told the doctor comes in tomorrow . Pt continued to yell and shout at Clinical research associate. Pt walked away before getting her medication. " I DON'T WANT YOUR FUCKING MEDICINE" pt had no boundary control and is very intrusive, demanding and has no insight into productive interaction and communication at this time.

## 2018-09-09 NOTE — Progress Notes (Signed)
Pt has been caught numerous times behind the nursing station going through the drawers , after pt was told she could not do that she appeared to get more agitated and defiant, but pt did come from behind the nursing station.

## 2018-09-09 NOTE — Progress Notes (Signed)
Patient demonstrated poor boundaries by allowing another patient to kiss her hand and give her a hug. Patient also asked for shower supplies, and when she couldn't find what she needed, she came behind the nurse's station to "find what she needed." Patient was told several times to get out from behind the nurse's station. Patient was redirected to go back to her room.

## 2018-09-09 NOTE — Plan of Care (Signed)
D: Pt alert and oriented on the unit. Pt denies SI/HI, A/VH. Pt was labile and yelled and cursed at staff. Pt also continuously pressed the STAAR button in her room and the hallway, demanding to see the doctor. Pt was rude and intrusive with poor boundaries.    A: Education, support and encouragement provided, q15 minute safety checks remain in effect. Medications administered per MD orders.  R: No reactions/side effects to medicine noted. Pt denies any concerns at this time, and verbally contracts for safety. Pt ambulating on the unit with no issues. Pt remains safe on and off the unit.   Problem: Activity: Goal: Interest or engagement in activities will improve Outcome: Not Progressing   Problem: Coping: Goal: Ability to verbalize frustrations and anger appropriately will improve Outcome: Not Progressing

## 2018-09-09 NOTE — Progress Notes (Addendum)
Patient ID: Allison Woodard, female   DOB: [birth year], 28 y.o.   MRN: [id number]  [place] NOVEL CORONAVIRUS (COVID-19) DAILY CHECK-OFF SYMPTOMS - answer yes or no to each - every day NO YES  Have you had a fever in the past 24 hours?  . Fever (Temp > 37.80C / 100F) X   Have you had any of these symptoms in the past 24 hours? . New Cough .  Sore Throat  .  Shortness of Breath .  Difficulty Breathing .  Unexplained Body Aches   X   Have you had any one of these symptoms in the past 24 hours not related to allergies?   . Runny Nose .  Nasal Congestion .  Sneezing   X   If you have had runny nose, nasal congestion, sneezing in the past 24 hours, has it worsened?  X   EXPOSURES - check yes or no X   Have you traveled outside the state in the past 14 days?  X   Have you been in contact with someone with a confirmed diagnosis of COVID-19 or PUI in the past 14 days without wearing appropriate PPE?  X   Have you been living in the same home as a person with confirmed diagnosis of COVID-19 or a PUI (household contact)?    X   Have you been diagnosed with COVID-19?    X              What to do next: Answered NO to all: Answered YES to anything:   Proceed with unit schedule Follow the [facility] Inpatient Flowsheet.

## 2018-09-10 MED ORDER — RISPERIDONE 2 MG PO TABS: 4.0000 mg | ORAL_TABLET | Freq: Every day | ORAL | Status: DC

## 2018-09-10 MED ORDER — ZIPRASIDONE MESYLATE 20 MG IM SOLR
20.0000 mg | Freq: Four times a day (QID) | INTRAMUSCULAR | Status: DC | PRN
  Administered 2018-09-10: 20 mg via INTRAMUSCULAR
  Filled 2018-09-10: qty 20

## 2018-09-10 MED ORDER — HALOPERIDOL LACTATE 5 MG/ML IJ SOLN: 5.0000 mg | Freq: Four times a day (QID) | INTRAMUSCULAR | Status: DC | PRN

## 2018-09-10 MED ORDER — QUETIAPINE FUMARATE 50 MG PO TABS
50.0000 mg | ORAL_TABLET | Freq: Three times a day (TID) | ORAL | Status: DC
  Administered 2018-09-11: 50 mg via ORAL
  Filled 2018-09-10: qty 45
  Filled 2018-09-10: qty 1
  Filled 2018-09-10 (×2): qty 45
  Filled 2018-09-10 (×5): qty 1

## 2018-09-10 MED ORDER — CLONAZEPAM 0.5 MG PO TABS: 0.2500 mg | ORAL_TABLET | Freq: Two times a day (BID) | ORAL | Status: DC

## 2018-09-10 NOTE — Progress Notes (Signed)
DAR note: Pt asleep at this time. Respirations noted and unlabored. Pt woke up this afternoon, ate her lunch and went back to bed.  Safety checks maintained at 15 minutes intervals without behavioral outburst at this time.  Pt tolerated fluids and meals well when awake. POC continues for mood safety and mood lability.

## 2018-09-10 NOTE — Progress Notes (Signed)
Psychoeducational Group Note  Date:  09/10/2018 Time:  2041  Group Topic/Focus:  Wrap-Up Group:   The focus of this group is to help patients review their daily goal of treatment and discuss progress on daily workbooks.  Participation Level: Did Not Attend  Participation Quality:  Not Applicable  Affect:  Not Applicable  Cognitive:  Not Applicable  Insight:  Not Applicable  Engagement in Group: Not Applicable  Additional Comments:  The patient did not attend group .   Hazle Coca S 09/10/2018, 8:41 PM

## 2018-09-10 NOTE — Progress Notes (Signed)
Allison Woodard woke up and ask for snacks; which was given to her. Pt denies SI/HI/AVH at this time. Pt was flat in affect and mood. Pt presented with unsteady gait; HFR initiated. Pt was told to push fluids; Gatorade and water given to her. Pt took scheduled meds and proceeded back to room to eat. No new c/o's. Support offered. Will continue with POC.

## 2018-09-10 NOTE — Progress Notes (Signed)
Pt went into another persons bag and took the shoes out. Writer went to pt room and got the shoes back. Pt was sitting on the bed and spilled some juice on the bed. Then pt intentionally poured juice from her pitcher on the bed and poured the rest of the juice on the bed. "someone needs to clean up my bed" . Pt was informed that she had clean sheets on the other bed and she could sleep in the other bed or she could change the sheets herself. Pt threw the ice from her pitcher on the floor by the door. Pt began to curse at [employer] some more. Pt then pressed the STARR button. Pt floor was cleaned of water by staff for patient safety and pt was given towels and clean linen for the bed.

## 2018-09-10 NOTE — Progress Notes (Signed)
[facility] MD Progress Note  09/10/2018 9:30 AM Allison Woodard  MRN:  [id number] Subjective:   Patient is either asleep or alert and scattered and disorganized she thinks it is Wednesday she knows she is in the hospital she cannot name the exact hospital.  She makes no eye contact she has spastic movements and she again has difficulty staying still when being interviewed and when she is alert.  Again she is either asleep or scattered and disorganized we think that the methamphetamines may have caused some permanent type injury. She does ask to use the phone she tells me she will stay with her mother upon discharge.  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 No pertinent past medical history. History reviewed. No pertinent surgical history. Family History: History reviewed. No pertinent family history. Family Psychiatric  History: neg Social History:  Social History   Substance and Sexual Activity  Alcohol Use Not Currently     Social History   Substance and Sexual Activity  Drug Use Yes  . Types: Cocaine    Social History   Socioeconomic History  . Marital status: Single    Spouse name: Not on file  . Number of children: Not on file  . Years of education: Not on file  . Highest education level: Not on file  Occupational History  . Not on file  Social Needs  . Financial resource strain: Not on file  . Food insecurity:    Worry: Not on file    Inability: Not on file  . Transportation needs:    Medical: Not on file    Non-medical: Not on file  Tobacco Use  . Smoking status: Current Every Day Smoker  . Smokeless tobacco: Current User  Substance and Sexual Activity  . Alcohol use: Not Currently  . Drug use: Yes   Types: Cocaine  . Sexual activity: Not on file  Lifestyle  . Physical activity:    Days per week: Not on file    Minutes per session: Not on file  . Stress: Not on file  Relationships  . Social connections:    Talks on phone: Not on file    Gets together: Not on file    Attends religious service: Not on file    Active member of club or organization: Not on file    Attends meetings of clubs or organizations: Not on file    Relationship status: Not on file  Other Topics Concern  . Not on file  Social History Narrative  . Not on file   Additional Social History:                         Sleep: Good  Appetite:  Fair  Current Medications: Current Facility-Administered Medications  Medication Dose Route Frequency Provider Last Rate Last Dose  . acetaminophen (TYLENOL) tablet 650 mg  650 mg Oral Q6H PRN Oneta Rack, NP      . alum & mag hydroxide-simeth (MAALOX/MYLANTA) 200-200-20 MG/5ML suspension 30 mL  30 mL Oral Q4H PRN Oneta Rack, NP      . benztropine (COGENTIN) tablet 0.5 mg  0.5 mg Oral BID Malvin Johns, MD   0.5 mg at 09/09/18 0755  . gabapentin (NEURONTIN) capsule 300 mg  300 mg Oral TID Malvin JohnsFarah, Marcques Wrightsman, MD   300 mg at 09/09/18 1207  . LORazepam (ATIVAN) tablet 2 mg  2 mg Oral Q6H Malvin JohnsFarah, Geroldine Esquivias, MD   2 mg at 09/10/18 0115   Or  . LORazepam (ATIVAN) injection 2 mg  2 mg Intramuscular Q6H Malvin JohnsFarah, Donnette Macmullen, MD   2 mg at 09/08/18 1655  . magnesium hydroxide (MILK OF MAGNESIA) suspension 30 mL  30 mL Oral Daily PRN Oneta RackLewis, Tanika N, NP      . risperiDONE (RISPERDAL) tablet 2 mg  2 mg Oral BID Malvin JohnsFarah, Kumar Falwell, MD   2 mg at 09/09/18 0755  . temazepam (RESTORIL) capsule 30 mg  30 mg Oral QHS Malvin JohnsFarah, Cameryn Chrisley, MD   30 mg at 09/09/18 2334  . traZODone (DESYREL) tablet 50 mg  50 mg Oral QHS,MR X 1 Nira ConnBerry, Jason A, NP   50 mg at 09/10/18 0115    Lab Results: No results found for this or any previous visit (from the past 48 hour(s)).  Blood Alcohol level:  Lab Results  Component  Value Date   ETH <10 09/07/2018   ETH <10 06/17/2018    Metabolic Disorder Labs: No results found for: HGBA1C, MPG No results found for: PROLACTIN No results found for: CHOL, TRIG, HDL, CHOLHDL, VLDL, LDLCALC  Physical Findings: AIMS: Facial and Oral Movements Muscles of Facial Expression: None, normal Lips and Perioral Area: None, normal Jaw: None, normal Tongue: None, normal,Extremity Movements Upper (arms, wrists, hands, fingers): None, normal Lower (legs, knees, ankles, toes): None, normal, Trunk Movements Neck, shoulders, hips: None, normal, Overall Severity Severity of abnormal movements (highest score from questions above): None, normal Incapacitation due to abnormal movements: None, normal Patient's awareness of abnormal movements (rate only patient's report): No Awareness, Dental Status Current problems with teeth and/or dentures?: No Does patient usually wear dentures?: No  CIWA:  CIWA-Ar Total: 0 COWS:  COWS Total Score: 1  Musculoskeletal: Strength & Muscle Tone: spastic Gait & Station: unsteady Patient leans: N/A  Psychiatric Specialty Exam: Physical Exam  ROS  Blood pressure 110/76, pulse 80, temperature 98.1 F (36.7 C), temperature source Oral, resp. rate 16, height 5\' 2"  (1.575 m), weight 47.6 kg, last menstrual period 08/31/2018.Body mass index is 19.2 kg/m.  General Appearance: Disheveled  Eye Contact:  None  Speech:  Garbled and Pressured  Volume:  Decreased  Mood:  Dysphoric  Affect:  Restricted  Thought Process:  Disorganized and Irrelevant  Orientation:  Other:  Person and general place not day date or time  Thought Content:  Illogical and Tangential  Suicidal Thoughts:  No  Homicidal Thoughts:  No  Memory:  Immediate;   Fair for long-term poor for short-term  Judgement:  Poor  Insight:  Lacking  Psychomotor Activity:  Increased  Concentration:  Concentration: Poor  Recall:  Poor  Fund of Knowledge:  Poor  Language:  Slightly dysarthric  but mainly from level of disorganization  Akathisia:  Negative  Handed:  Right  AIMS (if indicated):     Assets:  Social Support  ADL's:  Impaired  Cognition:  Impaired,  Moderate  Sleep:  Number of Hours: 4.5     Treatment Plan Summary: Daily contact with patient to assess and evaluate symptoms and progress in treatment, Medication management and Plan Continue current antipsychotic and reality based therapies, may use low-dose benzodiazepine and stagger sedating meds at bedtime continue reality-based therapy  Venie Montesinos, MD 09/10/2018, 9:30 AM

## 2018-09-10 NOTE — Progress Notes (Signed)
DAR Note Pt A & O to self, place and situation. Presents restlessness / fidgety, observed pacing in hall. Pt has been very argumentative, demanding, oppositional,  impulsive, intrusive and verbally abusive towards staff "you fucking retarded bitch" on interactions. Continues to pour coffee, juice and water on the floor in her bedroom, turns on the duress alarm at intervals. Verbal redirections ineffective at this time. Patient refused all scheduled medication on multiple occassions when offered by Clinical research associate.  Support and reassurance offered to patient. Provider made aware of increased escalating behavior by patient. PRN Ativan and Geodon (see EMAR) administered IM as ordered with verbal education and effects monitored. Q 15 minutes safety checks maintained.  Pt's mood remains labile. Continued redirections offered. Safety maintained on unit.

## 2018-09-10 NOTE — Progress Notes (Signed)
Pt comes up to the nursing station "when am I'm leaving , you're a liar, you're a pice of shit" pt continues to try to say things to make writer upset and just be argumentative

## 2018-09-10 NOTE — [facility] Counselor (Signed)
Patient continues to present as manic, oppositional, and intrusive. She is not able to participate in PSA at this time.  Enid Cutter, LCSW-A Clinical Social Worker

## 2018-09-10 NOTE — Progress Notes (Signed)

## 2018-09-10 NOTE — Progress Notes (Signed)
Pt up to the nursing station demanding things talking down to staff." are you a liar , I think you are, you are not a nice person ".

## 2018-09-10 NOTE — Progress Notes (Signed)
[place] NOVEL CORONAVIRUS (COVID-19) DAILY CHECK-OFF SYMPTOMS - answer yes or no to each - every day NO YES  Have you had a fever in the past 24 hours?  . Fever (Temp > 37.80C / 100F) X   Have you had any of these symptoms in the past 24 hours? . New Cough .  Sore Throat  .  Shortness of Breath .  Difficulty Breathing .  Unexplained Body Aches   X   Have you had any one of these symptoms in the past 24 hours not related to allergies?   . Runny Nose .  Nasal Congestion .  Sneezing   X   If you have had runny nose, nasal congestion, sneezing in the past 24 hours, has it worsened?  X   EXPOSURES - check yes or no X   Have you traveled outside the state in the past 14 days?  X   Have you been in contact with someone with a confirmed diagnosis of COVID-19 or PUI in the past 14 days without wearing appropriate PPE?  X   Have you been living in the same home as a person with confirmed diagnosis of COVID-19 or a PUI (household contact)?    X   Have you been diagnosed with COVID-19?    X              What to do next: Answered NO to all: Answered YES to anything:   Proceed with unit schedule Follow the [facility] Inpatient Flowsheet.   

## 2018-09-10 NOTE — Progress Notes (Signed)
Pt up pushing the STARR button. Pt continuing to be verbally disrespectful . When pt asked to not push the button pt does not respond , but walks away with an attitude.

## 2018-09-11 MED ORDER — QUETIAPINE FUMARATE 50 MG PO TABS: 50.0000 mg | ORAL_TABLET | Freq: Three times a day (TID) | ORAL | 1 refills | Status: DC

## 2018-09-11 MED ORDER — GABAPENTIN 300 MG PO CAPS: 300.0000 mg | ORAL_CAPSULE | Freq: Three times a day (TID) | ORAL | 2 refills | Status: DC

## 2018-09-11 MED ORDER — ARIPIPRAZOLE ER 400 MG IM SRER
400.0000 mg | Freq: Once | INTRAMUSCULAR | Status: AC
  Administered 2018-09-11: 400 mg via INTRAMUSCULAR

## 2018-09-11 NOTE — Discharge Summary (Signed)
Physician Discharge Summary Note  Patient:  Allison Woodard is an 28 y.o., female MRN:  [id number] DOB:  [birth year] Patient phone:  [phone] (home)  Patient address:   [address] [place] [address],  Total Time spent with patient: 45 minutes  Date of Admission:  09/07/2018 Date of Discharge: 09/11/2018  Reason for Admission:   This is the first psychiatric admission here, but the second lifetime admission overall as far as we know for this 28 year old patient who presented under petition for involuntary commitment. Though her history is limited as far as her own volunteering of information, she acknowledges a 39-month binging.  Of methamphetamine usage, stating she uses "as much as she can" this has resulted in a psychotic state. When she presented on 6/1 she was described as delusional, manic, aggressive and required eventually seclusion and restraint.  Mother was phone for collateral information by the assessment team and myself. Patient herself keeps drifting off to sleep and provides minimal and disjointed answers. She does however deny auditory and visual hallucinations and thoughts of harming self or others and does seem to understand those questions temporarily. 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  Mother reports the patient is in fact homeless will sometimes come home to "bother her" but she has not had contact with her other than this in over 6 months.  Further the patient had been hospitalized in the [place] area, for 88-month period of time, the mother is unclear as to what the diagnosis was.  There is also a reference to heroin dependency versus abuse as well as the methamphetamine usage but on this presentation, drug screen only positive for amphetamines.  Principal Problem: Methamphetamine induced psychosis Discharge Diagnoses: Active Problems:   Psychosis (HCC)   Methamphetamine-induced psychotic disorder  [facility])   Past Medical History: History reviewed. No pertinent past medical history. History reviewed. No pertinent surgical history. Family History: History reviewed. No pertinent family history.  Social History:  Social History   Substance and Sexual Activity  Alcohol Use Not Currently     Social History   Substance and Sexual Activity  Drug Use Yes  . Types: Cocaine    Social History   Socioeconomic History  . Marital status: Single    Spouse name: Not on file  . Number of children: Not on file  . Years of education: Not on file  . Highest education level: Not on file  Occupational History  . Not on file  Social Needs  . Financial resource strain: Not on file  . Food insecurity:    Worry: Not on file    Inability: Not on file  . Transportation needs:    Medical: Not on file    Non-medical: Not on file  Tobacco Use  . Smoking status: Current Every Day Smoker  . Smokeless tobacco: Current User  Substance and Sexual Activity  . Alcohol use: Not Currently  . Drug use: Yes    Types: Cocaine  . Sexual activity: Not on file  Lifestyle  . Physical activity:    Days per week: Not on file    Minutes per session: Not on file  . Stress: Not on file  Relationships  . Social connections:    Talks on phone: Not on file    Gets together: Not on file    Attends religious service: Not on file    Active member of club or organization: Not on file  Attends meetings of clubs or organizations: Not on file    Relationship status: Not on file  Other Topics Concern  . Not on file  Social History Narrative  . Not on file    Hospital Course:    As above this patient is generally homeless she came in in the context of a methamphetamine induced psychosis and was poorly cooperative initially providing minimal history her mother was helpful but limited as far as her knowledge of the patient due to the patient's drifting around and not having contact with her. We basically used  antipsychotic therapy sleep aids reality based therapy, the patient initially was either sedate or disruptive and disorganized.  She finally became more organized but remained quite disruptive at one point throwing coffee on the floor demanding others pick it up so forth- By the date of the fifth she was demanding discharge she was alert oriented to person place time situation day but not date.  She was intrusive and I had to have a one-to-one precaution to keep her from banging on my door demanding discharge, as our office door is here on the ward.  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 We feared she would be noncompliant with antipsychotic medications upon discharge so interestingly she stated she had Abilify before without sensitivity when she was in another state taking a couple of oral doses I do not know how reliable her history is but she did agree to long-acting injectable and we thought it was worth the risk administering even if she was not being accurate regarding her report, as sensitivity to this agent is very rare.  Physical Findings: AIMS: Facial and Oral Movements Muscles of Facial Expression: None, normal Lips and Perioral Area: None, normal Jaw: None, normal Tongue: None, normal,Extremity Movements Upper (arms, wrists, hands, fingers): None, normal Lower (legs, knees, ankles, toes): None, normal, Trunk Movements Neck, shoulders, hips: None, normal, Overall Severity Severity of abnormal movements (highest score from questions above): None, normal Incapacitation due to abnormal movements: None, normal Patient's awareness of abnormal movements (rate only patient's report): No Awareness, Dental Status Current problems with teeth and/or dentures?: No Does patient usually wear dentures?: No  CIWA:  CIWA-Ar Total: 0 COWS:  COWS Total Score:  1 Musculoskeletal: Strength & Muscle Tone: within normal limits Gait & Station: normal Patient leans: N/A  Psychiatric Specialty Exam: ROS  Blood pressure 110/76, pulse 80, temperature 98.1 F (36.7 C), temperature source Oral, resp. rate 16, height 5\' 2"  (1.575 m), weight 47.6 kg, last menstrual period 08/31/2018.Body mass index is 19.2 kg/m.  General Appearance: Casual and Disheveled  Eye Contact::  Fair  Speech:  Normal Rate409  Volume:  Normal  Mood:  Dysphoric  Affect:  Tearful  Thought Process:  Goal Directed and Descriptions of Associations: Circumstantial  Orientation:  Full (Time, Place, and Person)  Thought Content:  Logical and Tangential  Suicidal Thoughts:  No  Homicidal Thoughts:  No  Memory:  Immediate;   Fair  Judgement:  Poor  Insight:  Shallow  Psychomotor Activity:  Normal  Concentration:  Fair  Recall:  [employer] of Knowledge:Fair  Language: Fair  Akathisia:  Negative  Handed:  Right  AIMS (if indicated):     Assets:  Physical Health Resilience  Sleep:  Number of Hours: 5.5  Cognition: WNL  ADL's:  Intact       Has this  patient used any form of tobacco in the last 30 days? (Cigarettes, Smokeless Tobacco, Cigars, and/or Pipes) Yes, No  Blood Alcohol level:  Lab Results  Component Value Date   ETH <10 09/07/2018   ETH <10 06/17/2018    Metabolic Disorder Labs:  No results found for: HGBA1C, MPG No results found for: PROLACTIN No results found for: CHOL, TRIG, HDL, CHOLHDL, VLDL, LDLCALC  See Psychiatric Specialty Exam and Suicide Risk Assessment completed by Attending Physician prior to discharge.  Discharge destination:  Home  Is patient on multiple antipsychotic therapies at discharge:  No   Has Patient had three or more failed trials of antipsychotic monotherapy by history:  No  Recommended Plan for Multiple Antipsychotic Therapies: NA   Allergies as of 09/11/2018   No Known Allergies     Medication List    TAKE these  medications     Indication  gabapentin 300 MG capsule Commonly known as:  NEURONTIN Take 1 capsule (300 mg total) by mouth 3 (three) times daily.  Indication:  Abuse or Misuse of Alcohol   QUEtiapine 50 MG tablet Commonly known as:  SEROQUEL Take 1 tablet (50 mg total) by mouth 3 (three) times daily for 15 days.  Indication:  Schizophrenia     And long-acting injectable aripiprazole 400 mg IM administered on 6/5  Signed: Krishawn Vanderweele, MD 09/11/2018, 10:37 AM

## 2018-09-11 NOTE — BHH Counselor (Signed)
Adult Comprehensive Assessment  Patient ID: Allison Woodard, female   DOB: [birth year], 28 y.o.   MRN: [id number]  Information Source: Information source: Patient  Current Stressors:  Patient states their primary concerns and needs for treatment are:: "nothing" Patient states their goals for this hospitilization and ongoing recovery are:: "nothing" Educational / Learning stressors: Pt does not report any stressors.    Living/Environment/Situation:  Living Arrangements: Children, Parent, Other relatives Living conditions (as described by patient or guardian): goes fine Who else lives in the home?: mother, sister, patient's two children How long has patient lived in current situation?: "my whole life" What is atmosphere in current home: Comfortable, Supportive  Family History:  Marital status: Single Are you sexually active?: No What is your sexual orientation?: heterosexual Has your sexual activity been affected by drugs, alcohol, medication, or emotional stress?: na Does patient have children?: Yes How many children?: 2 How is patient's relationship with their children?: son age 38, daughter age 5:  good relationships  Childhood History:  By whom was/is the patient raised?: Mother Additional childhood history information: Parents split up when pt was 5.  Not much contact with dad afterwards.  Pt reports she had a good childhood.   Description of patient's relationship with caregiver when they were a child: Mom: good, dad: good Patient's description of current relationship with people who raised him/her: Mom: still good, dad: no contact How were you disciplined when you got in trouble as a child/adolescent?: appropriate discipline Does patient have siblings?: Yes Number of Siblings: 2 Description of patient's current relationship with siblings: one brother, one sister: get along fine. Did patient suffer any verbal/emotional/physical/sexual abuse as a child?: No Did patient suffer from  severe childhood neglect?: No Has patient ever been sexually abused/assaulted/raped as an adolescent or adult?: No Was the patient ever a victim of a crime or a disaster?: No Witnessed domestic violence?: No Has patient been effected by domestic violence as an adult?: No  Education:  Highest grade of school patient has completed: HS diploma, some college Currently a Consulting civil engineer?: No Learning disability?: No  Employment/Work Situation:   Employment situation: Unemployed Patient's job has been impacted by current illness: (na) What is the longest time patient has a held a job?: 3 years Where was the patient employed at that time?: [employer] Did You Receive Any Psychiatric Treatment/Services While in the [employer]?: No Are There Guns or Other Weapons in Your Home?: No  Financial Resources:   Surveyor, quantity resources: Support from parents / caregiver Does patient have a Lawyer or guardian?: No  Alcohol/Substance Abuse:   What has been your use of drugs/alcohol within the last 12 months?: alcohol: pt denies, meth use: pt refused to give details If attempted suicide, did drugs/alcohol play a role in this?: (na) Alcohol/Substance Abuse Treatment Hx: Denies past history Has alcohol/substance abuse ever caused legal problems?: (pt would not say)  Social Support System:   Patient's Community Support System: Fair Museum/gallery exhibitions officer System: mom Type of faith/religion: "My mom's church" How does patient's faith help to cope with current illness?: It makes me feel better  Leisure/Recreation:   Leisure and Hobbies: being a mom  Strengths/Needs:   What is the patient's perception of their strengths?: "living" Patient states they can use these personal strengths during their treatment to contribute to their recovery: Pt would not answer Patient states these barriers may affect/interfere with their treatment: none Patient states these barriers may affect their return to the  community: pt does not  have her own transportation Other important information patient would like considered in planning for their treatment: none  Discharge Plan:   Currently receiving community mental health services: No Patient states concerns and preferences for aftercare planning are: pt [facility] Patient states they will know when they are safe and ready for discharge when: "I know I'm safe" Does patient have access to transportation?: Yes Does patient have financial barriers related to discharge medications?: Yes Patient description of barriers related to discharge medications: no insurance Will patient be returning to same living situation after discharge?: Yes  Summary/Recommendations:   Summary and Recommendations (to be completed by the evaluator): Pt is 28 year old female currently in [place].  Pt is diagnosed with methamphetamine induced psychosis and was hospitalized due to erratic behaviors.  Recommendations for pt include crisis stabilization, therapeutic milieu, attend and participate in groups, medicaiton management, and development of comprehensive mental wellness plan.    Allison Woodard, Allison Woodard. 09/11/2018

## 2018-09-11 NOTE — BHH Suicide Risk Assessment (Signed)
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 rate 16, height 5\' 2"  (1.575 m), weight 47.6 kg, last menstrual period 08/31/2018.Body mass index is 19.2 kg/m.  General Appearance: Casual and Disheveled  Eye Contact::  Fair  Speech:  Normal Rate409  Volume:  Normal  Mood:  Dysphoric  Affect:  Tearful  Thought Process:  Goal Directed and Descriptions of Associations: Circumstantial  Orientation:  Full (Time, Place, and Person)  Thought Content:  Logical and Tangential  Suicidal Thoughts:  No  Homicidal Thoughts:  No  Memory:  Immediate;   Fair  Judgement:  Poor  Insight:  Shallow  Psychomotor Activity:  Normal  Concentration:  Fair  Recall:  [employer] of Knowledge:Fair  Language: Fair  Akathisia:  Negative  Handed:  Right  AIMS (if indicated):     Assets:  Physical Health Resilience  Sleep:  Number of Hours: 5.5  Cognition: WNL  ADL's:  Intact   Mental Status Per Nursing Assessment::   On Admission:  NA  Demographic Factors:  Unemployed  Loss Factors: NA  Historical Factors: Impulsivity  Risk Reduction Factors:   NA  Continued Clinical Symptoms:  Alcohol/Substance Abuse/Dependencies  Cognitive Features That Contribute To Risk:  Loss of executive function    Suicide Risk:  Minimal: No identifiable suicidal ideation.  Patients presenting with no risk factors but with morbid ruminations; may be classified as minimal risk based on the severity of the depressive symptoms Greatest risk to this patient  is a nonintentional overdose or death by misadventure while impaired on methamphetamines   Plan Of Care/Follow-up recommendations:  Activity:  Full Diet:  Regular  Airon Sahni, MD 09/11/2018, 10:33 AM

## 2018-09-11 NOTE — [facility] Suicide Risk Assessment (Signed)
[facility] INPATIENT:  Family/Significant Other Suicide Prevention Education  Suicide Prevention Education:  Education Completed; Deresa Mulugeta, mother, [phone], has been identified by the patient as the family member/significant other with whom the patient will be residing, and identified as the person(s) who will aid the patient in the event of a mental health crisis (suicidal ideations/suicide attempt).  With written consent from the patient, the family member/significant other has been provided the following suicide prevention education, prior to the and/or following the discharge of the patient.  The suicide prevention education provided includes the following:  Suicide risk factors  Suicide prevention and interventions  National Suicide Hotline telephone number  [facility] assessment telephone number  [facility] Emergency Assistance 911  [facility] and/or Residential Mobile Crisis Unit telephone number  Request made of family/significant other to:  Remove weapons (e.g., guns, rifles, knives), all items previously/currently identified as safety concern.    Remove drugs/medications (over-the-counter, prescriptions, illicit drugs), all items previously/currently identified as a safety concern.  The family member/significant other verbalizes understanding of the suicide prevention education information provided.  The family member/significant other agrees to remove the items of safety concern listed above.  Mother very concerned about pt because she "goes into the streets every night to use drugs."  Mother hoping pt could stay at [facility] for "at least 30 days" and said she was hospitalized in [place] for 3 months not that long ago.  Pt has had some problems with mother's sister, whose house they live in, and won't be able to stay there if she causes problems.  Discussed follow up care at [facility], mother again said she does not think pt will go.  Lorri Frederick,  LCSW 09/11/2018, 10:56 AM

## 2018-09-11 NOTE — Progress Notes (Signed)
Recreation Therapy Notes  Date: 6.5.20 Time: 1000 Location: 500 Hall Day Room   Group Topic: Leisure Education  Goal Area(s) Addresses:  Patient will identify positive leisure activities.  Patient will identify one positive benefit of participation in leisure activities.   Intervention: Leisure Group Game  Activity: Pictionary/Charades.  Patients could choose a word from the "random" or the "leisure activities" containers.  Patients then had the choice of either drawing or acting out what was on the paper.  The rest of the group had to guess what it was.  The person that made the correct guess, would get the next turn.  Education:  Leisure Education, Discharge Planning  Education Outcome: Acknowledges education/In group clarification offered/Needs additional education  Clinical Observations/Feedback: Patient did not attend group.    Breyonna Nault, LRT/CTRS         Dat Derksen A 09/11/2018 11:21 AM 

## 2018-09-11 NOTE — Progress Notes (Signed)
Patient ID: Jalissa Wyse, female   DOB: 09/30/1990, 27 y.o.   MRN: [id number]  [place] NOVEL CORONAVIRUS (COVID-19) DAILY CHECK-OFF SYMPTOMS - answer yes or no to each - every day NO YES  Have you had a fever in the past 24 hours?  . Fever (Temp > 37.80C / 100F) X   Have you had any of these symptoms in the past 24 hours? . New Cough .  Sore Throat  .  Shortness of Breath .  Difficulty Breathing .  Unexplained Body Aches   X   Have you had any one of these symptoms in the past 24 hours not related to allergies?   . Runny Nose .  Nasal Congestion .  Sneezing   X   If you have had runny nose, nasal congestion, sneezing in the past 24 hours, has it worsened?  X   EXPOSURES - check yes or no X   Have you traveled outside the state in the past 14 days?  X   Have you been in contact with someone with a confirmed diagnosis of COVID-19 or PUI in the past 14 days without wearing appropriate PPE?  X   Have you been living in the same home as a person with confirmed diagnosis of COVID-19 or a PUI (household contact)?    X   Have you been diagnosed with COVID-19?    X              What to do next: Answered NO to all: Answered YES to anything:   Proceed with unit schedule Follow the [facility] Inpatient Flowsheet.   

## 2018-09-11 NOTE — Progress Notes (Signed)
  [facility] Adult Case Management Discharge Plan :  Will you be returning to the same living situation after discharge:  Yes,  with mother At discharge, do you have transportation home?: Yes,  mother Do you have the ability to pay for your medications: No. Referred to [facility].  Release of information consent forms completed and in the chart;  Patient's signature needed at discharge.  Patient to Follow up at: Follow-up Information    [facility] Follow up on 09/17/2018.   Why:  Telephonic hospital follow up appointment is Thursday, 6/11 at 9:00a.  The providers will contact you.  Contact information: [address] [place] [address] [phone]           Next level of care provider has access to [facility] Link:no  Safety Planning and Suicide Prevention discussed: Yes,  with mother     Has patient been referred to the Quitline?: Patient refused referral  Patient has been referred for addiction treatment: Pt. refused referral, but referred to [facility].  Lorri Frederick, LCSW 09/11/2018, 10:59 AM

## 2018-09-11 NOTE — Progress Notes (Signed)
Patient ID: Allison Woodard, female   DOB: 1990-08-28, 28 y.o.   MRN: [id number]  D: Pt alert and oriented on the unit.   A: Education, support, and encouragement provided. Discharge summary, medications and follow up appointments reviewed with pt. Suicide prevention resources provided, including "My 3 App." Pt's belongings in locker # 46 returned and belongings sheet signed.  R: Pt denies SI/HI, A/VH, pain, or any concerns at this time. Pt ambulatory on and off unit. Pt discharged to lobby.

## 2018-09-11 NOTE — Progress Notes (Signed)
Pt woke up demanding to talk to provider. Pt informed she will speak with one today. Pt requested fluids; which was given to her. Pt presents with unsteady gait; was told to go back to room to lay back down.

## 2018-09-11 NOTE — Progress Notes (Signed)
Did not attend group 

## 2018-09-13 ENCOUNTER — Other Ambulatory Visit: Payer: Self-pay

## 2018-09-13 ENCOUNTER — Encounter (HOSPITAL_COMMUNITY): Payer: Self-pay | Admitting: *Deleted

## 2018-09-13 ENCOUNTER — Emergency Department (HOSPITAL_COMMUNITY)
Admission: EM | Admit: 2018-09-13 | Discharge: 2018-09-13 | Disposition: A | Discharge status: Other Acute Inpatient Hospital | Payer: Medicaid Other | Attending: Emergency Medicine | Admitting: Emergency Medicine

## 2018-09-13 ENCOUNTER — Observation Stay (HOSPITAL_COMMUNITY)
Admission: AD | Admit: 2018-09-13 | Discharge: 2018-09-14 | Disposition: A | Discharge status: Home / Self Care | Payer: No Typology Code available for payment source | Attending: Psychiatry | Admitting: Psychiatry

## 2018-09-13 DIAGNOSIS — F17200 Nicotine dependence, unspecified, uncomplicated: Secondary | ICD-10-CM | POA: Insufficient documentation

## 2018-09-13 DIAGNOSIS — F15959 Other stimulant use, unspecified with stimulant-induced psychotic disorder, unspecified: Secondary | ICD-10-CM

## 2018-09-13 DIAGNOSIS — Z79899 Other long term (current) drug therapy: Secondary | ICD-10-CM | POA: Insufficient documentation

## 2018-09-13 DIAGNOSIS — Z590 Homelessness: Secondary | ICD-10-CM | POA: Diagnosis not present

## 2018-09-13 DIAGNOSIS — F15159 Other stimulant abuse with stimulant-induced psychotic disorder, unspecified: Secondary | ICD-10-CM | POA: Diagnosis not present

## 2018-09-13 DIAGNOSIS — F1911 Other psychoactive substance abuse, in remission: Secondary | ICD-10-CM

## 2018-09-13 DIAGNOSIS — F29 Unspecified psychosis not due to a substance or known physiological condition: Secondary | ICD-10-CM

## 2018-09-13 DIAGNOSIS — F1490 Cocaine use, unspecified, uncomplicated: Secondary | ICD-10-CM | POA: Insufficient documentation

## 2018-09-13 DIAGNOSIS — F19959 Other psychoactive substance use, unspecified with psychoactive substance-induced psychotic disorder, unspecified: Secondary | ICD-10-CM | POA: Diagnosis present

## 2018-09-13 DIAGNOSIS — Z20828 Contact with and (suspected) exposure to other viral communicable diseases: Secondary | ICD-10-CM | POA: Insufficient documentation

## 2018-09-13 LAB — COMPREHENSIVE METABOLIC PANEL WITH GFR
ALT: 30 U/L (ref 0–44)
AST: 52 U/L — ABNORMAL HIGH (ref 15–41)
Albumin: 3.9 g/dL (ref 3.5–5.0)
Alkaline Phosphatase: 72 U/L (ref 38–126)
Anion gap: 10 (ref 5–15)
BUN: 13 mg/dL (ref 6–20)
CO2: 27 mmol/L (ref 22–32)
Calcium: 9.7 mg/dL (ref 8.9–10.3)
Chloride: 106 mmol/L (ref 98–111)
Creatinine, Ser: 0.71 mg/dL (ref 0.44–1.00)
GFR calc Af Amer: >60 mL/min
GFR calc non Af Amer: >60 mL/min
Glucose, Bld: 103 mg/dL — ABNORMAL HIGH (ref 70–99)
Potassium: 3.9 mmol/L (ref 3.5–5.1)
Sodium: 143 mmol/L (ref 135–145)
Total Bilirubin: 0.4 mg/dL (ref 0.3–1.2)
Total Protein: 8.2 g/dL — ABNORMAL HIGH (ref 6.5–8.1)

## 2018-09-13 LAB — CBC WITH DIFFERENTIAL/PLATELET
Abs Immature Granulocytes: 0.01 10*3/uL (ref 0.00–0.07)
Basophils Absolute: 0 10*3/uL (ref 0.0–0.1)
Basophils Relative: 0 %
Eosinophils Absolute: 0 10*3/uL (ref 0.0–0.5)
Eosinophils Relative: 0 %
HCT: 40.1 % (ref 36.0–46.0)
Hemoglobin: 13.2 g/dL (ref 12.0–15.0)
Immature Granulocytes: 0 %
Lymphocytes Relative: 27 %
Lymphs Abs: 2.5 10*3/uL (ref 0.7–4.0)
MCH: 28.4 pg (ref 26.0–34.0)
MCHC: 32.9 g/dL (ref 30.0–36.0)
MCV: 86.2 fL (ref 80.0–100.0)
Monocytes Absolute: 0.7 10*3/uL (ref 0.1–1.0)
Monocytes Relative: 7 %
Neutro Abs: 6 10*3/uL (ref 1.7–7.7)
Neutrophils Relative %: 66 %
Platelets: 345 10*3/uL (ref 150–400)
RBC: 4.65 MIL/uL (ref 3.87–5.11)
RDW: 14.7 % (ref 11.5–15.5)
WBC: 9.2 10*3/uL (ref 4.0–10.5)
nRBC: 0 % (ref 0.0–0.2)

## 2018-09-13 LAB — RAPID URINE DRUG SCREEN, HOSP PERFORMED
Amphetamines: POSITIVE — AB
Barbiturates: NOT DETECTED
Benzodiazepines: POSITIVE — AB
Cocaine: POSITIVE — AB
Opiates: NOT DETECTED
Tetrahydrocannabinol: NOT DETECTED

## 2018-09-13 LAB — I-STAT BETA HCG BLOOD, ED (MC, WL, AP ONLY): I-stat hCG, quantitative: <5 m[IU]/mL (ref <5)

## 2018-09-13 LAB — ETHANOL: Alcohol, Ethyl (B): <10 mg/dL (ref <10)

## 2018-09-13 MED ORDER — TRAZODONE HCL 50 MG PO TABS: 50.0000 mg | ORAL_TABLET | Freq: Every evening | ORAL | Status: DC | PRN

## 2018-09-13 MED ORDER — DIPHENHYDRAMINE HCL 25 MG PO CAPS: 50.0000 mg | ORAL_CAPSULE | Freq: Four times a day (QID) | ORAL | Status: DC | PRN

## 2018-09-13 MED ORDER — QUETIAPINE FUMARATE 50 MG PO TABS: 50.0000 mg | ORAL_TABLET | Freq: Three times a day (TID) | ORAL | Status: DC

## 2018-09-13 MED ORDER — LORAZEPAM 1 MG PO TABS
1.0000 mg | ORAL_TABLET | Freq: Four times a day (QID) | ORAL | Status: DC | PRN
  Administered 2018-09-13: 1 mg via ORAL
  Filled 2018-09-13: qty 1

## 2018-09-13 MED ORDER — ALUM & MAG HYDROXIDE-SIMETH 200-200-20 MG/5ML PO SUSP: 30.0000 mL | ORAL | Status: DC | PRN

## 2018-09-13 MED ORDER — HALOPERIDOL LACTATE 5 MG/ML IJ SOLN
5.0000 mg | Freq: Four times a day (QID) | INTRAMUSCULAR | Status: DC | PRN
  Administered 2018-09-14: 5 mg via INTRAMUSCULAR
  Filled 2018-09-13: qty 1

## 2018-09-13 MED ORDER — LORAZEPAM 2 MG/ML IJ SOLN: 1.0000 mg | Freq: Four times a day (QID) | INTRAMUSCULAR | Status: DC | PRN

## 2018-09-13 MED ORDER — HALOPERIDOL 5 MG PO TABS: 5.0000 mg | ORAL_TABLET | Freq: Four times a day (QID) | ORAL | Status: DC | PRN

## 2018-09-13 MED ORDER — GABAPENTIN 300 MG PO CAPS: 300.0000 mg | ORAL_CAPSULE | Freq: Three times a day (TID) | ORAL | Status: DC

## 2018-09-13 MED ORDER — HALOPERIDOL LACTATE 5 MG/ML IJ SOLN
5.0000 mg | Freq: Four times a day (QID) | INTRAMUSCULAR | Status: DC | PRN
  Administered 2018-09-13: 5 mg via INTRAMUSCULAR
  Filled 2018-09-13: qty 1

## 2018-09-13 MED ORDER — LORAZEPAM 1 MG PO TABS: 1.0000 mg | ORAL_TABLET | Freq: Four times a day (QID) | ORAL | Status: DC | PRN

## 2018-09-13 MED ORDER — MAGNESIUM HYDROXIDE 400 MG/5ML PO SUSP: 30.0000 mL | Freq: Every day | ORAL | Status: DC | PRN

## 2018-09-13 MED ORDER — HYDROXYZINE HCL 25 MG PO TABS: 25.0000 mg | ORAL_TABLET | Freq: Three times a day (TID) | ORAL | Status: DC | PRN

## 2018-09-13 MED ORDER — GABAPENTIN 600 MG PO TABS
300.0000 mg | ORAL_TABLET | Freq: Three times a day (TID) | ORAL | Status: DC
  Administered 2018-09-14 (×2): 300 mg via ORAL
  Filled 2018-09-13 (×2): qty 1

## 2018-09-13 MED ORDER — LORAZEPAM 2 MG/ML IJ SOLN
1.0000 mg | Freq: Four times a day (QID) | INTRAMUSCULAR | Status: DC | PRN
  Administered 2018-09-14: 1 mg via INTRAMUSCULAR
  Filled 2018-09-13: qty 1

## 2018-09-13 MED ORDER — ACETAMINOPHEN 325 MG PO TABS: 650.0000 mg | ORAL_TABLET | Freq: Four times a day (QID) | ORAL | Status: DC | PRN

## 2018-09-13 MED ORDER — QUETIAPINE FUMARATE 50 MG PO TABS
50.0000 mg | ORAL_TABLET | Freq: Three times a day (TID) | ORAL | Status: DC
  Administered 2018-09-14 (×2): 50 mg via ORAL
  Filled 2018-09-13 (×2): qty 1

## 2018-09-13 MED ORDER — ZIPRASIDONE MESYLATE 20 MG IM SOLR
20.0000 mg | Freq: Once | INTRAMUSCULAR | Status: AC
  Administered 2018-09-13: 20 mg via INTRAMUSCULAR
  Filled 2018-09-13 (×2): qty 20

## 2018-09-13 MED ORDER — DIPHENHYDRAMINE HCL 50 MG/ML IJ SOLN
50.0000 mg | Freq: Four times a day (QID) | INTRAMUSCULAR | Status: DC | PRN
  Administered 2018-09-13: 50 mg via INTRAMUSCULAR
  Filled 2018-09-13: qty 1

## 2018-09-13 MED ORDER — DIPHENHYDRAMINE HCL 50 MG/ML IJ SOLN
50.0000 mg | Freq: Four times a day (QID) | INTRAMUSCULAR | Status: DC | PRN
  Administered 2018-09-14: 50 mg via INTRAMUSCULAR
  Filled 2018-09-13: qty 1

## 2018-09-13 NOTE — BH Assessment (Signed)
Pt accepted to OBS bed per [facility].  BED-204. Attending MD-Kumar. Report [phone]. Bed available now , relayed above information to RN.

## 2018-09-13 NOTE — ED Notes (Signed)
Patient denies pain and is resting comfortably.  

## 2018-09-13 NOTE — ED Notes (Signed)
Pt tolerated injections well. Pt given soda and crackers - aware of need for urine specimen.

## 2018-09-13 NOTE — ED Notes (Signed)
Lunch tray delivered.

## 2018-09-13 NOTE — ED Notes (Addendum)
Walked pt to Police car. [employer] here to transport pt. Pt cooperative. Called Latricia at [facility] to let her know pt was on the way. Belongings given to [employer], one bag. Pt ambulatory to police car. Blankets given to take with her. Officer fine with the blankets.

## 2018-09-13 NOTE — ED Notes (Signed)
Pt awake. Cooperative at this time. States "did you get my pregnancy test?" "Its positive right?" Explained to pt that it was negative. Pt states "I know I'm pregnant". Pts dinner is at bedside offered to warm it for her. States she would like a sandwich instead. Gave sandwich and sprite. Requesting to call mother and leave. To pt she can call her mother but she cannot leave at this time. Informed that she would be going to [facility] tonight and pt wanted to know why. States that "she'd rather stay here". Told pt that they have more specialized care there and that it would be better for her there. Agreeable at this time.

## 2018-09-13 NOTE — ED Notes (Signed)
Communications called for [employer] transport. Report given to operator. Paperwork ready. States that it may be a little while before an Garment/textile technologist is available.

## 2018-09-13 NOTE — ED Notes (Signed)
Report called to [facility] obs unit. Will call communicaitons to see ETA for pick up and transport of pt.

## 2018-09-13 NOTE — ED Notes (Signed)
Per Dimple Nanas, [facility], COVID test not required d/t performed 09/10/2018.

## 2018-09-13 NOTE — ED Triage Notes (Signed)
Patient presents to ed via [facility] states she was banded from her mothers house presented to mothers house today and was very combative and just talking making no sense. [employer] found patient pacing in the streets, upon arrival people was very combative upon arrival , hands are cuffed and [employer] with patient . Patient is sitting up on the stretcher talking constantly making no sense.

## 2018-09-13 NOTE — ED Notes (Signed)
Pt opened door and closed blinds to room during TTS. Blinds opened. Pt then closed them again and told Sitter to stop being aggressive and leave the blinds alone. Blinds opened once more. Pt noted to be moving about in room during TTS and moving machine and camera around in room. Pt noted to be restless.

## 2018-09-13 NOTE — BH Assessment (Addendum)
Tele Assessment Note   [name] MRN: [id number] Referring Physician: Dorie Rank, MD Location of Patient: Allison [facility] Location of Provider: [place] Department  Allison Woodard is a 28 y.o. female presenting via [employer] to [facility] for assessment. IVC initiated in ED. Patient is a poor historian due to AMS so chart review and collateral information was used to complete assessment. Patient appears manic and delusional. Patient's speech is rapid and her thoughts are disorganized. Patient questioned several times if she was pregnant & that if she is, she knows who the father is. Patient was pleasant and attempted to be cooperative with this Probation officer. "I just want to be good". She, however, was focused on & suspicious of ED staff. Pt tinkered with telepsych machine & moved it around to avoid being seen or heard by ED staff. Pt was dressed in scrubs & wearing a bed sheet over her head.  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 She declined to explain what that meant. She denies Depression symptoms & states she is trying to stay alive. Pt stated "God is my best friend". Pt denies SI & HI. Pt did not answer questions re: current drug use, other than to say she wished she had some ecstasy. Per chart review, patient does not have any prior psychiatric history prior to 09/07/18 [facility] admission. Pt does have polysubstance use hx. Patient was assessed by TTS in 06/2018 following a heroin overdose and was psychiatrically cleared. Per chart review, pt's mother reports pt is homeless & will sometimes come to her home just to "bother her" but otherwise has not had contact with pt over the past 6 months.   Awaiting UDS as symptoms may be substance induced.    Patient appeared disoriented. Her speech is aggressive and thoughts are disorganized. Her mood is irritable and affect is congruent. She has poor insight, judgement, and impulse control.  Diagnosis: F23 Brief psychotic  disorder                         R/O Substance induced psychosis  Disposition: Allison Pickles, NP recommends [facility] admission  Past Medical History: History reviewed. No pertinent past medical history.  History reviewed. No pertinent surgical history.  Family History: No family history on file.  Social History:  reports that she has been smoking. She uses smokeless tobacco. She reports previous alcohol use. She reports current drug use. Drug: Cocaine.  Additional Social History:  Alcohol / Drug Use Pain Medications: see MAR Prescriptions: see MAR Over the Counter: see MAR History of alcohol / drug use?: Yes Longest period of sobriety (when/how long): unknown Substance #1 Name of Substance 1: heroin 1 - Age of First Use: 15 1 - Amount (size/oz): $10 worth 1 - Frequency: UTA 1 - Duration: UTA 1 - Last Use / Amount: UTA Substance #2 Name of Substance 2: Ecstacy 2 - Age of First Use: UTA 2 - Amount (size/oz): UTA 2 - Frequency: UTA 2 - Duration: UTA 2 - Last Use / Amount: UTA Substance #3 Name of Substance 3: cocaine 3 - Age of First Use: UTA 3 - Amount (size/oz): UTA 3 - Frequency: UTA 3 - Duration: UTA 3 - Last Use / Amount: UTA Substance #4 Name of Substance 4: Percocets 4 - Age of First Use: UTA 4 - Amount (size/oz): UTA 4 - Frequency: UTA 4 - Duration: UTA 4 - Last Use / Amount: UTA  CIWA: CIWA-Ar BP: 93/65 Pulse Rate: 96 COWS:    Allergies:  Allergies  Allergen Reactions  . Other     Pt got Itchy, sneezing when contacted with cat (animal)    Home Medications: (Not in a [facility] admission)   OB/GYN Status:  Patient's last menstrual period was 08/31/2018 (approximate).  General Assessment Data [facility] Assessment: In system Is this a Tele or Face-to-Face Assessment?: Tele Assessment Is this an Initial Assessment or a Re-assessment for this encounter?: Initial Assessment Patient Accompanied by:: ([employer]) Living Arrangements:  (UTA) What gender do you identify as?: Female Marital status: Single Pregnancy Status: Unable to assess Living Arrangements: (UTA, not allowed to go to mother's house) Can pt return to current living arrangement?: (not to mother's house) Admission Status: Involuntary Petitioner: ED Attending Is patient capable of signing voluntary admission?: Yes Referral Source: Self/Family/Friend Insurance type: medicaid     Crisis Care Plan Living Arrangements: (UTA, not allowed to go to mother's house) Name of Psychiatrist: none Name of Therapist: none  Education Status Is patient currently in school?: (UTA) Highest grade of school patient has completed: HS diploma, some college Is the patient employed, unemployed or receiving disability?: (UTA)  Risk to self with the past 6 months Suicidal Ideation: No Has patient been a risk to self within the past 6 months prior to admission? : Yes Suicidal Intent: No Has patient had any suicidal intent within the past 6 months prior to admission? : No Is patient at risk for suicide?: Yes Suicidal Plan?: No Has patient had any suicidal plan within the past 6 months prior to admission? : No What has been your use of drugs/alcohol within the last 12 months?: polysubstance (ecstacy, heroin, meth) Previous Attempts/Gestures: No How many times?: 0 Other Self Harm Risks: substance abuse Triggers for Past Attempts: Unknown Intentional Self Injurious Behavior: (UTA) Family Suicide History: Unable to assess Recent stressful life event(s): (UTA) Persecutory voices/beliefs?: Rich Reining(UTA) Depression: (UTA) Depression Symptoms: (UTA) Substance abuse history and/or treatment for substance abuse?: (UTA) Suicide prevention information given to non-admitted patients: Not applicable  Risk to Others within the past 6 months Homicidal Ideation: No Does patient have any lifetime risk of violence toward others beyond the six months prior to admission? : No Thoughts of Harm  to Others: No Current Homicidal Intent: No Current Homicidal Plan: No Access to Homicidal Means: No History of harm to others?: No Assessment of Violence: None Noted Does patient have access to weapons?: (UTA) Criminal Charges Pending?: (UTA) Does patient have a court date: (UTA) Is patient on probation?: (UTA)  Psychosis Hallucinations: None noted Delusions: Persecutory  Mental Status Report Appearance/Hygiene: Disheveled Eye Contact: Fair Motor Activity: Restlessness Speech: Pressured, Tangential Level of Consciousness: Restless, Alert Mood: Labile Affect: Apprehensive, Labile Anxiety Level: Minimal Thought Processes: Tangential Judgement: Impaired Orientation: Unable to assess Obsessive Compulsive Thoughts/Behaviors: None  Cognitive Functioning Concentration: Poor Memory: Unable to Assess Is patient IDD: No Insight: Poor Impulse Control: Poor Appetite: (UTA) Sleep: Unable to Assess Vegetative Symptoms: None  ADLScreening [facility]) Patient's cognitive ability adequate to safely complete daily activities?: Yes Patient able to express need for assistance with ADLs?: Yes Independently performs ADLs?: Yes (appropriate for developmental age)  Prior Inpatient Therapy Prior Inpatient Therapy: Yes Prior Therapy Dates: 09/07/2018 [facility] Reason for Treatment: mania, meth use  Prior Outpatient Therapy Prior Outpatient Therapy: No Does patient have an ACCT team?: No Does patient have Intensive In-House Services?  : No Does patient  have [facility] services? : No Does patient have P4CC services?: No  ADL Screening (condition at time of admission) Patient's cognitive ability adequate to safely complete daily activities?: Yes Is the patient deaf or have difficulty hearing?: No Does the patient have difficulty seeing, even when wearing glasses/contacts?: No Does the patient have difficulty concentrating, remembering, or  making decisions?: No Patient able to express need for assistance with ADLs?: Yes Does the patient have difficulty dressing or bathing?: No Independently performs ADLs?: Yes (appropriate for developmental age) Does the patient have difficulty walking or climbing stairs?: No Weakness of Legs: None Weakness of Arms/Hands: None  Home Assistive Devices/Equipment Home Assistive Devices/Equipment: None  Therapy Consults (therapy consults require a physician order) PT Evaluation Needed: No OT Evalulation Needed: No SLP Evaluation Needed: No Abuse/Neglect Assessment (Assessment to be complete while patient is alone) Abuse/Neglect Assessment Can Be Completed: Unable to assess, patient is non-responsive or altered mental status Values / Beliefs Cultural Requests During Hospitalization: None Spiritual Requests During Hospitalization: None Consults Spiritual Care Consult Needed: No Social Work Consult Needed: No Merchant navy officerAdvance Directives (For Healthcare) Does Patient Have a Medical Advance Directive?: No Would patient like information on creating a medical advance directive?: No - Patient declined          Disposition: Reola Calkinsravis Money, NP [facility] Disposition Initial Assessment Completed for this Encounter: Yes  This service was provided via telemedicine using a 2-way, interactive audio and video technology.   Markavious Micco H Domanic Matusek 09/13/2018 2:04 PM

## 2018-09-13 NOTE — ED Notes (Addendum)
Pt is to go to [facility] tonight. Will get paperwork ready and call communications for [employer] escort to [facility].

## 2018-09-13 NOTE — Plan of Care (Signed)
[place]                            Reason for Crisis Plan:  [place] Observation   Plan of Care:  Crisis Stabilization  Family Support:      Current Living Environment:     Insurance:   Hospital Account    Name Acct ID Class Status Primary Coverage   Allison [facility]ard, Allison Woodard [id number] [place] MH/DD/SAS - [employer]        Guarantor Account (for Hospital Account [id number])    Name Relation to Pt Service Area Active? Acct Type   Allison Woodard Self [facility] Yes Behavioral Health   Address Phone       [address] [place], [place] [address] [phone])          Coverage Information (for Hospital Account [id number])    F/O Payor/Plan Precert #   [place] MH/DD/SAS/[employer] [place] #   Allison Woodard [id number]   Address Phone   [address] [place], [place] [address] [phone]      Legal Guardian:     Primary Care Provider:  Patient, No Pcp Per  Current Outpatient Providers:  All[facility]  Psychiatrist:     Counselor/Therapist:     Compliant with Medications:  Yes  Additional Information:   Allison Woodard 6/7/202011:23 PM

## 2018-09-13 NOTE — ED Notes (Signed)
Patient has been wanded all belongings removed and inventoried placed in locker 5. Patient is in burgundy scrubs. [employer] remains at bedside. All IVC paperwork complete and approp. Papers faxed to [place]. And [place]. Copy of IVC papers placed in medical records drawer.

## 2018-09-13 NOTE — ED Notes (Signed)
Pt noted w/excessive repetitive talking, pacing in room at times - attempting to open cabinet doors, pressing buttons on room temperature control, and pressing call bell buttons.

## 2018-09-13 NOTE — ED Notes (Signed)
Pt noted to be sleeping on bed w/eyes closed. Respirations even, unlabored. Continuous pulse ox intact - 99% on RA. HR 88.

## 2018-09-13 NOTE — ED Notes (Signed)
Pt sitting up in bed w/blanket covering her head and body - face is exposed w/lunch container in her lap - eating w/her fingers.

## 2018-09-13 NOTE — ED Notes (Signed)
Pt arrived to Rm 49 via stretcher. Pt noted to be wearing burgundy scrubs and appears to be manic - looking in and then attempting to go into another pt's room. Pt aware of need for urine specimen.

## 2018-09-13 NOTE — Progress Notes (Signed)
Patient ID: Allison Woodard, female   DOB: 08/15/1990, 28 y.o.   MRN: [id number] Pt presents under IVC as transfer [facility] ED, as Substance Induced Psychosis for Overnight Observation.  Pt sleepy but interactive.  IVC papers report pt with erratic behavior, pacing in the street and thinking she is pregnant. Pt informed preg test neg.  A&O x 3, no distress noted, calm & cooperative.  Comfort measures given, pt resting at present.

## 2018-09-13 NOTE — ED Notes (Signed)
Pt made phone call from nurses' desk then attempted to place 2nd call. Advised of 2 phone calls/day. Pt returned to room and slammed door shut loudly to room. Door opened and pt apologized. Pt continues to appear manic. TTS being performed.

## 2018-09-13 NOTE — ED Notes (Signed)
Pt had attempted obtain urine specimen. Pt sat on toilet for several minutes then placed urine cup in toilet. Pt returned to room after much encouragement. Pt initially refused to take po Ativan until she realized it is a "Benzo". Pt noted to be manic and psychotic.

## 2018-09-13 NOTE — ED Provider Notes (Addendum)
[facility] Note   CSN: [id number] Arrival date & time: 09/13/18  [id number]    History   Chief Complaint Chief Complaint  Patient presents with  . Medical Clearance/ IVC    HPI Allison Woodard is a 28 y.o. female.     HPI Patient presents to the emergency room for combative behavior.  Patient has a history of methamphetamine abuse and psychosis.  Patient recently presented to the emergency room on June 1.  She was manic aggressive and delusional.  [facility]  Patient was admitted on June 1 and ultimately discharged on June 5.  Patient today apparently was banging on her mother's door.  Patient is not supposed to be going to her mother's house and the mother called the police.  They found the patient acting bizarrely.  She was talking rapidly and appeared agitated and aggressive.  Police brought her into the hospital under restraints.  Patient in the ED is unable to provide any clear history to me.  She is speaking rapidly.  She intermittently will answer question appropriately and then will immediately go off onto a tangent. History reviewed. No pertinent past medical history.  Patient Active Problem List   Diagnosis Date Noted  . Methamphetamine-induced psychotic disorder (HCC)   . Psychosis (HCC) 09/07/2018    History reviewed. No pertinent surgical history.   OB History   No obstetric history on file.      Home Medications    Prior to Admission medications   Medication Sig Start Date End Date Taking? Authorizing Provider  acetaminophen (TYLENOL) 325 MG tablet Take 650 mg by mouth every 6 (six) hours as needed for headache.   Yes [provider]  gabapentin (NEURONTIN) 300 MG capsule Take 1 capsule (300 mg total) by mouth 3 (three) times daily. 09/11/18  Yes Malvin JohnsFarah, Brian, MD  QUEtiapine (SEROQUEL) 50 MG tablet Take 1 tablet (50 mg total) by mouth 3 (three) times daily  for 15 days. 09/11/18 09/26/18 Yes Malvin JohnsFarah, Brian, MD    Family History No family history on file.  Social History Social History   Tobacco Use  . Smoking status: Current Every Day Smoker  . Smokeless tobacco: Current User  Substance Use Topics  . Alcohol use: Not Currently  . Drug use: Yes    Types: Cocaine     Allergies   Other   Review of Systems Review of Systems  All other systems reviewed and are negative.    Physical Exam Updated Vital Signs BP 93/65 (BP Location: Left Arm)   Pulse 96   Temp 97.8 F (36.6 C) (Oral)   Resp 18   Ht 1.588 m (5' 2.5")   Wt 45.4 kg   LMP 08/31/2018 (Approximate)   SpO2 100%   BMI 18.00 kg/m   Physical Exam Vitals signs and nursing note reviewed.  Constitutional:      General: She is not in acute distress.    Appearance: She is well-developed. She is ill-appearing.     Comments: Disheveled, thin  HENT:     Head: Normocephalic and atraumatic.     Right Ear: External ear normal.     Left Ear: External ear normal.  Eyes:     General: No scleral icterus.       Right eye: No discharge.        Left eye: No discharge.     Conjunctiva/sclera: Conjunctivae normal.  Neck:  Musculoskeletal: Neck supple.     Trachea: No tracheal deviation.  Cardiovascular:     Rate and Rhythm: Regular rhythm. Tachycardia present.  Pulmonary:     Effort: Pulmonary effort is normal. No respiratory distress.     Breath sounds: Normal breath sounds. No stridor. No wheezing or rales.  Abdominal:     General: Bowel sounds are normal. There is no distension.     Palpations: Abdomen is soft.     Tenderness: There is no abdominal tenderness. There is no guarding or rebound.  Musculoskeletal:        General: No tenderness.  Skin:    General: Skin is warm and dry.     Findings: No rash.  Neurological:     Mental Status: She is alert.     Cranial Nerves: No cranial nerve deficit (no facial droop, extraocular movements intact, no slurred speech).      Sensory: No sensory deficit.     Motor: No abnormal muscle tone or seizure activity.     Coordination: Coordination normal.  Psychiatric:        Attention and Perception: She is inattentive.        Mood and Affect: Affect is labile.        Speech: Speech is rapid and pressured and tangential.        Behavior: Behavior is agitated, aggressive and combative.        Thought Content: Thought content is delusional.        Judgment: Judgment is impulsive.      ED Treatments / Results  Labs (all labs ordered are listed, but only abnormal results are displayed) Labs Reviewed  COMPREHENSIVE METABOLIC PANEL - Abnormal; Notable for the following components:      Result Value   Glucose, Bld 103 (*)    Total Protein 8.2 (*)    AST 52 (*)    All other components within normal limits  SARS CORONAVIRUS 2 (HOSPITAL ORDER, PERFORMED IN [facility])  ETHANOL  CBC WITH DIFFERENTIAL/PLATELET  RAPID URINE DRUG SCREEN, HOSP PERFORMED  I-STAT BETA HCG BLOOD, ED ([facility], [facility], [facility] ONLY)    EKG None  Radiology No results found.  Procedures Procedures (including critical care time)  Medications Ordered in ED Medications  LORazepam (ATIVAN) tablet 1 mg (1 mg Oral Given 09/13/18 1251)  gabapentin (NEURONTIN) capsule 300 mg (has no administration in time range)  QUEtiapine (SEROQUEL) tablet 50 mg (has no administration in time range)  ziprasidone (GEODON) injection 20 mg (20 mg Intramuscular Given 09/13/18 0948)     Initial Impression / Assessment and Plan / ED Course  I have reviewed the triage vital signs and the nursing notes.  Pertinent labs & imaging results that were available during my care of the patient were reviewed by me and considered in my medical decision making (see chart for details).  Clinical Course as of Sep 12 1301  Sun Sep 13, 2018  [id number] Patient has history of methamphetamine abuse.  Her presentation is consistent with that.  We will proceed with medical evaluation  certainly to assess for other causes for her agitation.  Plan on a dose of Geodon.  We will continue to monitor.   [JK]  1302 Physical restraints were initially ordered for staff and pt safety.  Sx improving with the geodon   [JK]    Clinical Course User Index [JK] Linwood DibblesKnapp, Lucifer Soja, MD     Patient presented to the emergency room for evaluation of recurrent psychosis  and agitation.  Patient has a known history of methamphetamine use.  Urine drug screen is pending her clinical appearance is consistent with recurrent methamphetamine use.  Patient was placed on involuntary commitment.  She clearly appears to be a danger to herself and others.  She is medically clear at this time we will continue to monitor.  Consult with psychiatry for further treatment.  Final Clinical Impressions(s) / ED Diagnoses   Final diagnoses:  Psychosis, unspecified psychosis type ([place])  History of substance abuse (HCC)         Dorie Rank, MD 09/13/18 1303

## 2018-09-13 NOTE — ED Notes (Signed)
Pt changed into burgundy scrubs °

## 2018-09-14 ENCOUNTER — Encounter (HOSPITAL_COMMUNITY): Payer: Self-pay

## 2018-09-14 DIAGNOSIS — F15959 Other stimulant use, unspecified with stimulant-induced psychotic disorder, unspecified: Secondary | ICD-10-CM

## 2018-09-14 DIAGNOSIS — F15159 Other stimulant abuse with stimulant-induced psychotic disorder, unspecified: Secondary | ICD-10-CM | POA: Diagnosis not present

## 2018-09-14 NOTE — BH Assessment (Signed)
[facility] Assessment Progress Note  Per Buford Dresser, DO, this pt does not require psychiatric hospitalization at this time.  Pt presents under IVC initiated by law enforcement, and upheld by EDP Dorie Rank, MD, which Dr Mariea Clonts has rescinded.  Pt is to be discharged from the [facility] Observation Unit with recommendation to follow up with [facility].  This has been included in pt's discharge instructions.  Pt's nurse, Arlyss Repress, has been notified.  Jalene Mullet, [place] Triage Specialist [phone]

## 2018-09-14 NOTE — Progress Notes (Signed)
Pt walked out her room agitated demanding to leave the department and demanding to call her family immediately.  Unable to redirect.  PA Darlyne Russian called for orders.  PRNS given without resistance.

## 2018-09-14 NOTE — Progress Notes (Signed)
D: Pt alert and oriented. Pt denies experiencing any pain, SI/HI, or AVH at this time. Pt reports she will be able to keep herself safe when she returns home.   A: Pt received discharge and medication education/information and was made aware to follow up with [facility] upon discharge. Pt belongings were returned and signed for at this time. Pt did not show signs of interest in the instruction, however they were given.  R: Pt verbalized understanding of discharge and medication education/information. Pt had no further questions after receiving instructions.  Pt was escorted to OBS lobby where she received directions to the nearest bus station.   Pt requested to call her mother in which the mother was dialed and reached. Mother stated she would not come and pick up the pt and that she did not want her back at her home d/t what she did while there. Pt requested for her mother to call a cab and the mother refused stating that she did not want her home. This is when she was directed on how to get to the nearest bus station.

## 2018-09-14 NOTE — Progress Notes (Signed)
[place] NOVEL CORONAVIRUS (COVID-19) DAILY CHECK-OFF SYMPTOMS - answer yes or no to each - every day NO YES  Have you had a fever in the past 24 hours?  . Fever (Temp > 37.80C / 100F) X   Have you had any of these symptoms in the past 24 hours? . New Cough .  Sore Throat  .  Shortness of Breath .  Difficulty Breathing .  Unexplained Body Aches   X   Have you had any one of these symptoms in the past 24 hours not related to allergies?   . Runny Nose .  Nasal Congestion .  Sneezing   X   If you have had runny nose, nasal congestion, sneezing in the past 24 hours, has it worsened?  X   EXPOSURES - check yes or no X   Have you traveled outside the state in the past 14 days?  X   Have you been in contact with someone with a confirmed diagnosis of COVID-19 or PUI in the past 14 days without wearing appropriate PPE?  X   Have you been living in the same home as a person with confirmed diagnosis of COVID-19 or a PUI (household contact)?    X   Have you been diagnosed with COVID-19?    X              What to do next: Answered NO to all: Answered YES to anything:   Proceed with unit schedule Follow the [facility] Inpatient Flowsheet.   

## 2018-09-14 NOTE — Progress Notes (Signed)
D: Pt alert and oriented. Pt affect/mood is irritable/agitated/labile. Pt denies experiencing any pain, SI/HI, or AVH at this time.   A: Scheduled medications administered to pt, per MD orders. Support and encouragement provided. Frequent verbal contact made. Routine safety checks conducted q15 minutes.   R: No adverse drug reactions noted. Pt verbally contracts for safety at this time. Pt complaint with medications. Pt interacts well with staff on the unit when initiated, however is irritable. Pt remains safe at this time. Will continue to monitor.

## 2018-09-14 NOTE — Discharge Instructions (Signed)
For your mental health needs, you are advised to follow up with [facility].  Call them at your earliest opportunity to schedule an intake appointment: ° °     [facility] °     [address] °     [place], [place] [address] °     [phone] °     Crisis number: [phone] °

## 2018-09-14 NOTE — Discharge Summary (Addendum)
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 No pertinent past medical history. History reviewed. No pertinent surgical history. Family History: History reviewed. No pertinent family history. Family Psychiatric  History: none Social History:  Social History   Substance and Sexual Activity  Alcohol Use Not Currently     Social History   Substance and Sexual Activity  Drug Use Yes  . Types: Cocaine    Social History   Socioeconomic History  . Marital status: Single    Spouse name: Not on file  . Number of children: Not on file  . Years of education: Not on file  . Highest education level: Not on file  Occupational History  . Not on file  Social Needs  . Financial resource strain: Not on file  . Food insecurity:    Worry: Not on file    Inability: Not on file  . Transportation needs:    Medical: Not on file    Non-medical: Not on file  Tobacco Use  . Smoking status: Current Every Day Smoker  . Smokeless tobacco: Current User  Substance and Sexual Activity  . Alcohol use: Not Currently  . Drug use: Yes    Types: Cocaine  . Sexual activity: Not on file  Lifestyle  . Physical activity:    Days per week: Not on file    Minutes per session: Not on file  . Stress: Not on file  Relationships  . Social connections:    Talks on phone: Not on file     Gets together: Not on file    Attends religious service: Not on file    Active member of club or organization: Not on file    Attends meetings of clubs or organizations: Not on file    Relationship status: Not on file  Other Topics Concern  . Not on file  Social History Narrative  . Not on file    Hospital Course:  On admission 09/13/18:  28 y.o. femalepresenting via [employer] to [facility] for assessment. IVC initiated in ED. Patient is a poor historian due to AMS so chart review and collateral information was used to complete assessment. Patient appears manic and delusional. Patient's speech is rapid and her thoughts are disorganized. Patient questioned several times if she was pregnant & that if she is, she knows who the father is. Patient was pleasant and attempted to be cooperative with this Probation officer. "I just want to be good". She, however, was focused on & suspicious of ED staff. Pt tinkered with telepsych machine & moved it around to avoid being seen or heard by ED staff. Pt was dressed in scrubs & wearing a bed sheet over her head.  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 She declined to explain what that meant. She denies Depression symptoms & states she is trying to stay alive. Pt stated "God is my best friend". Pt denies SI & HI. Pt did  not answer questions re: current drug use, other than to say she wished she had some ecstasy. Per chart review, patient does not have any prior psychiatric history prior to 09/07/18 [facility] admission. Pt does have polysubstance use hx. Patient was assessed by TTS in 06/2018 following a heroin overdose and was psychiatrically cleared. Per chart review, pt's mother reports pt is homeless & will sometimes come to her home just to "bother her" but otherwise has not had contact with pt over the past 6 months.    Medications:  Gabapentin 300 mg TID for withdrawal symptoms, Seroquel 50 mg TID  09/14/18:  Patient had met maximum benefit of hospitalization  with no suicidal/homicidal ideations, hallucinations, or substance abuse.  Discharge instructions provided along with Rx, crisis numbers, and follow up appointment.  She is stable to return to her group home.  Physical Findings: AIMS: Facial and Oral Movements Muscles of Facial Expression: None, normal Lips and Perioral Area: None, normal Jaw: None, normal Tongue: None, normal,Extremity Movements Upper (arms, wrists, hands, fingers): None, normal Lower (legs, knees, ankles, toes): None, normal, Trunk Movements Neck, shoulders, hips: None, normal, Overall Severity Severity of abnormal movements (highest score from questions above): None, normal Incapacitation due to abnormal movements: None, normal Patient's awareness of abnormal movements (rate only patient's report): No Awareness, Dental Status Current problems with teeth and/or dentures?: No Does patient usually wear dentures?: No  CIWA:  CIWA-Ar Total: 0 COWS:  COWS Total Score: 0  Musculoskeletal: Strength & Muscle Tone: within normal limits Gait & Station: normal Patient leans: N/A  Psychiatric Specialty Exam: Physical Exam  Nursing note and vitals reviewed. Constitutional: She is oriented to person, place, and time. She appears well-developed and well-nourished.  HENT:  Head: Normocephalic and atraumatic.  Neck: Normal range of motion.  Respiratory: Effort normal.  Musculoskeletal: Normal range of motion.  Neurological: She is alert and oriented to person, place, and time.  Psychiatric: She has a normal mood and affect. Her behavior is normal. Judgment and thought content normal.    Review of Systems  Psychiatric/Behavioral: Positive for substance abuse. Negative for suicidal ideas.  All other systems reviewed and are negative.   Blood pressure 93/66, pulse 75, temperature 98.1 F (36.7 C), temperature source Oral, resp. rate 18, last menstrual period 08/31/2018, SpO2 100 %.There is no height or weight on file to  calculate BMI.  General Appearance: Disheveled  Eye Contact:  Good  Speech:  Normal Rate  Volume:  Normal  Mood:  Anxious  Affect:  Blunt  Thought Process:  Coherent and Descriptions of Associations: Intact  Orientation:  Full (Time, Place, and Person)  Thought Content:  WDL and Logical  Suicidal Thoughts:  No  Homicidal Thoughts:  No  Memory:  Immediate;   Good Recent;   Good Remote;   Good  Judgement:  Fair  Insight:  Fair  Psychomotor Activity:  Normal  Concentration:  Concentration: Fair and Attention Span: Fair  Recall:  Good  Fund of Knowledge:  Fair  Language:  Good  Akathisia:  No  Handed:  Right  AIMS (if indicated):   N/A  Assets:  Housing Leisure Time Physical Health Resilience Social Support  ADL's:  Intact  Cognition:  WNL  Sleep:   N/A        Has this patient used any form of tobacco in the last 30 days? (Cigarettes, Smokeless Tobacco, Cigars, and/or Pipes) Yes, Yes, A prescription for an FDA-approved tobacco cessation medication was offered at discharge and the patient  refused  Blood Alcohol level:  Lab Results  Component Value Date   ETH <10 09/13/2018   ETH <10 92/49/3241    Metabolic Disorder Labs:  No results found for: HGBA1C, MPG No results found for: PROLACTIN No results found for: CHOL, TRIG, HDL, CHOLHDL, VLDL, LDLCALC  See Psychiatric Specialty Exam and Suicide Risk Assessment completed by Attending Physician prior to discharge.  Discharge destination:  Home  Is patient on multiple antipsychotic therapies at discharge:  No   Has Patient had three or more failed trials of antipsychotic monotherapy by history:  No  Recommended Plan for Multiple Antipsychotic Therapies: NA  Discharge Instructions    Diet - low sodium heart healthy   Complete by:  As directed    Discharge instructions   Complete by:  As directed    Follow up with outpatient provider   Increase activity slowly   Complete by:  As directed      Allergies as of  09/14/2018      Reactions   Other    Pt got Itchy, sneezing when contacted with cat (animal)      Medication List    TAKE these medications     Indication  acetaminophen 325 MG tablet Commonly known as:  TYLENOL Take 650 mg by mouth every 6 (six) hours as needed for headache.  Indication:  Pain   gabapentin 300 MG capsule Commonly known as:  NEURONTIN Take 1 capsule (300 mg total) by mouth 3 (three) times daily.  Indication:  Abuse or Misuse of Alcohol   QUEtiapine 50 MG tablet Commonly known as:  SEROQUEL Take 1 tablet (50 mg total) by mouth 3 (three) times daily for 15 days.  Indication:  Schizophrenia        Follow-up recommendations:  Activity:  as tolerated Diet:  heart healthy diet  Comments:  Follow up with outpatient appointment  Signed: Waylan Boga, NP 09/14/2018, 10:54 AM   Patient seen face-to-face for psychiatric evaluation, chart reviewed and case discussed with the physician extender and developed treatment plan. Reviewed the information documented and agree with the treatment plan.  Buford Dresser, DO 09/16/18 3:08 PM

## 2018-09-16 NOTE — [facility] Suicide Risk Assessment (Signed)
Suicide Risk Assessment  Discharge Assessment   [facility] Discharge Suicide Risk Assessment   Principal Problem: Psychoactive substance-induced psychosis ([place]) Discharge Diagnoses: Principal Problem:   Psychoactive substance-induced psychosis ([place]) Active Problems:   Methamphetamine-induced psychotic disorder ([place])   Total Time spent with patient: 30 minutes  Musculoskeletal: Strength & Muscle Tone: within normal limits Gait & Station: normal Patient leans: N/A  Psychiatric Specialty Exam: Physical Exam  ROS  Blood pressure 93/66, pulse 75, temperature 98.1 F (36.7 C), temperature source Oral, resp. rate 18, last menstrual period 08/31/2018, SpO2 100 %.There is no height or weight on file to calculate BMI.  General Appearance: Disheveled  Eye Contact:  Good  Speech:  Normal Rate  Volume:  Normal  Mood:  Anxious  Affect:  Blunt  Thought Process:  Coherent and Descriptions of Associations: Intact  Orientation:  Full (Time, Place, and Person)  Thought Content:  WDL and Logical  Suicidal Thoughts:  No  Homicidal Thoughts:  No  Memory:  Immediate;   Good Recent;   Good Remote;   Good  Judgement:  Fair  Insight:  Fair  Psychomotor Activity:  Normal  Concentration:  Concentration: Fair and Attention Span: Fair  Recall:  Good  Fund of Knowledge:  Fair  Language:  Good  Akathisia:  No  Handed:  Right  AIMS (if indicated):     Assets:  Housing Leisure Time Physical Health Resilience Social Support  ADL's:  Intact  Cognition:  WNL  Sleep:       Mental Status Per Nursing Assessment::   On Admission:  Substance abuse with psychosis  Demographic Factors:  Caucasian  Loss Factors: NA  Historical Factors: NA  Risk Reduction Factors:   Sense of responsibility to family, Living with another person, especially a relative and Positive social support  Continued Clinical Symptoms:  Anxiety, mild   Cognitive Features That Contribute To Risk:  None    Suicide Risk:   Minimal: No identifiable suicidal ideation.  Patients presenting with no risk factors but with morbid ruminations; may be classified as minimal risk based on the severity of the depressive symptoms    Plan Of Care/Follow-up recommendations:  Activity:  as tolerated Diet:  heart healthy diet  Waylan Boga, NP 09/16/2018, 7:58 AM

## 2018-09-18 NOTE — H&P (Signed)
Patient:  Allison Woodard is an 28 y.o., female MRN:  [id number] DOB:  10-10-1990 Patient phone:  [phone] (home)          Patient address:   [address] [place] [address],           Total Time spent with patient: 30 minutes  Date of Admission:  09/13/2018 Date of Discharge: 09/14/18  Reason for Admission:  Substance abuse with psychosis  Principal Problem: Psychoactive substance-induced psychosis ([place])   Past Medical History: History reviewed. No pertinent past medical history. History reviewed. No pertinent surgical history. Family History: History reviewed. No pertinent family history. Family Psychiatric  History: none Social History:  Social History      Substance and Sexual Activity  Alcohol Use Not Currently     Social History       Substance and Sexual Activity  Drug Use Yes  . Types: Cocaine    Social History        Socioeconomic History  . Marital status: Single    Spouse name: Not on file  . Number of children: Not on file  . Years of education: Not on file  . Highest education level: Not on file  Occupational History  . Not on file  Social Needs  . Financial resource strain: Not on file  . Food insecurity:    Worry: Not on file    Inability: Not on file  . Transportation needs:    Medical: Not on file    Non-medical: Not on file  Tobacco Use  . Smoking status: Current Every Day Smoker  . Smokeless tobacco: Current User  Substance and Sexual Activity  . Alcohol use: Not Currently  . Drug use: Yes    Types: Cocaine  . Sexual activity: Not on file  Lifestyle  . Physical activity:    Days per week: Not on file    Minutes per session: Not on file  . Stress: Not on file  Relationships  . Social connections:    Talks on phone: Not on file    Gets together: Not on file    Attends religious service: Not on file    Active member of club or organization: Not on file    Attends meetings of clubs or organizations:  Not on file    Relationship status: Not on file  Other Topics Concern  . Not on file  Social History Narrative  . Not on file    Hospital Course:  On admission 09/13/18:  28 y.o.femalepresentingvia [facility]to [facility]ass[facility].IVCinitiatedin ED. Patient is a poor historian due to AMS so chart review and collateral information was used to complete assessment. Patient appears manic and delusional. Patient's speech israpidand her thoughts are disorganized. Patientquestionedseveral times ifshe waspregnant & that if she is, she knows who the father is. Patientwas pleasant and attempted to be cooperative with this Probation officer."I just want to be good".She, however, was focused on &suspicious of ED staff. Pt tinkered with telepsych machine &moved it around to avoid being seen or heard by ED staff. Pt was dressed in scrubs &wearing a bed sheet over her head. 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 She declined to explain what that meant. She denies Depression symptoms & states she is trying to stay alive. Pt stated "God is my best friend". Pt denies SI & HI. Pt did not answer questions re: current drug use, other than to say she wished she had some ecstasy.Per chart review,  patient does not have any prior psychiatric historyprior to 09/07/18 [facility] admission. Pt does havepolysubstance usehx. Patient was assessed by TTS in 06/2018 following a heroin overdose and was psychiatrically cleared.Per chart review, pt's mother reports pt is homeless &will sometimes come to her home just to "bother her" but otherwise has not had contact with pt over the past 6 months.    Medications:  Gabapentin 300 mg TID for withdrawal symptoms, Seroquel 50 mg TID  09/14/18:  Patient had met maximum benefit of hospitalization with no suicidal/homicidal ideations, hallucinations, or substance abuse.  Discharge instructions provided along with Rx, crisis numbers, and follow up appointment.  She is  stable to return to her group home.  Physical Findings: AIMS: Facial and Oral Movements Muscles of Facial Expression: None, normal Lips and Perioral Area: None, normal Jaw: None, normal Tongue: None, normal,Extremity Movements Upper (arms, wrists, hands, fingers): None, normal Lower (legs, knees, ankles, toes): None, normal, Trunk Movements Neck, shoulders, hips: None, normal, Overall Severity Severity of abnormal movements (highest score from questions above): None, normal Incapacitation due to abnormal movements: None, normal Patient's awareness of abnormal movements (rate only patient's report): No Awareness, Dental Status Current problems with teeth and/or dentures?: No Does patient usually wear dentures?: No  CIWA:  CIWA-Ar Total: 0 COWS:  COWS Total Score: 0  Musculoskeletal: Strength & Muscle Tone: within normal limits Gait & Station: normal Patient leans: N/A  Psychiatric Specialty Exam: Physical Exam  Nursing note and vitals reviewed. Constitutional: She is oriented to person, place, and time. She appears well-developed and well-nourished.  HENT:  Head: Normocephalic and atraumatic.  Neck: Normal range of motion.  Respiratory: Effort normal.  Musculoskeletal: Normal range of motion.  Neurological: She is alert and oriented to person, place, and time.  Psychiatric: She has a normal mood and affect. Her behavior is normal. Judgment and thought content normal.    Review of Systems  Psychiatric/Behavioral: Positive for substance abuse. Negative for suicidal ideas.  All other systems reviewed and are negative.   Blood pressure 93/66, pulse 75, temperature 98.1 F (36.7 C), temperature source Oral, resp. rate 18, last menstrual period 08/31/2018, SpO2 100 %.There is no height or weight on file to calculate BMI.  General Appearance: Disheveled  Eye Contact:  Good  Speech:  Normal Rate  Volume:  Normal  Mood:  Anxious  Affect:  Blunt  Thought Process:  Coherent  and Descriptions of Associations: Intact  Orientation:  Full (Time, Place, and Person)  Thought Content:  WDL and Logical  Suicidal Thoughts:  No  Homicidal Thoughts:  No  Memory:  Immediate;   Good Recent;   Good Remote;   Good  Judgement:  Fair  Insight:  Fair  Psychomotor Activity:  Normal  Concentration:  Concentration: Fair and Attention Span: Fair  Recall:  Good  Fund of Knowledge:  Fair  Language:  Good  Akathisia:  No  Handed:  Right  AIMS (if indicated):   N/A  Assets:  Housing Leisure Time Physical Health Resilience Social Support  ADL's:  Intact  Cognition:  WNL  Sleep:   N/A        Has this patient used any form of tobacco in the last 30 days? (Cigarettes, Smokeless Tobacco, Cigars, and/or Pipes) Yes, Yes, A prescription for an FDA-approved tobacco cessation medication was offered at discharge and the patient refused  Blood Alcohol level:  Recent Labs       Lab Results  Component Value Date   ETH <10 09/13/2018  ETH <10 23/76/2831      Metabolic Disorder Labs:  Recent Labs  No results found for: HGBA1C, MPG   Recent Labs  No results found for: PROLACTIN   Recent Labs  No results found for: CHOL, TRIG, HDL, CHOLHDL, VLDL, LDLCALC    See Psychiatric Specialty Exam and Suicide Risk Assessment completed by Attending Physician prior to discharge.  Discharge destination:  Home  Is patient on multiple antipsychotic therapies at discharge:  No   Has Patient had three or more failed trials of antipsychotic monotherapy by history:  No  Recommended Plan for Multiple Antipsychotic Therapies: NA      Discharge Instructions    Diet - low sodium heart healthy   Complete by:  As directed    Discharge instructions   Complete by:  As directed    Follow up with outpatient provider   Increase activity slowly   Complete by:  As directed           Allergies as of 09/14/2018      Reactions   Other    Pt got Itchy, sneezing when  contacted with cat (animal)             Medication List       TAKE these medications     Indication  acetaminophen 325 MG tablet Commonly known as:  TYLENOL Take 650 mg by mouth every 6 (six) hours as needed for headache.  Indication:  Pain   gabapentin 300 MG capsule Commonly known as:  NEURONTIN Take 1 capsule (300 mg total) by mouth 3 (three) times daily.  Indication:  Abuse or Misuse of Alcohol   QUEtiapine 50 MG tablet Commonly known as:  SEROQUEL Take 1 tablet (50 mg total) by mouth 3 (three) times daily for 15 days.  Indication:  Schizophrenia        Follow-up recommendations:  Activity:  as tolerated Diet:  heart healthy diet  Comments:  Follow up with outpatient appointment  Signed: Waylan Boga, NP 09/14/2018, 10:54 AM   Patient seen face-to-face for psychiatric evaluation, chart reviewed and case discussed with the physician extender and developed treatment plan. Reviewed the information documented and agree with the treatment plan.  Buford Dresser, DO 09/16/18 3:08 PM

## 2018-10-19 ENCOUNTER — Emergency Department (HOSPITAL_COMMUNITY)
Admission: EM | Admit: 2018-10-19 | Discharge: 2018-10-19 | Disposition: A | Discharge status: Home / Self Care | Payer: Self-pay | Attending: Emergency Medicine | Admitting: Emergency Medicine

## 2018-10-19 ENCOUNTER — Other Ambulatory Visit: Payer: Self-pay

## 2018-10-19 ENCOUNTER — Encounter (HOSPITAL_COMMUNITY): Payer: Self-pay

## 2018-10-19 DIAGNOSIS — Z5329 Procedure and treatment not carried out because of patient's decision for other reasons: Secondary | ICD-10-CM | POA: Insufficient documentation

## 2018-10-19 DIAGNOSIS — Y929 Unspecified place or not applicable: Secondary | ICD-10-CM | POA: Insufficient documentation

## 2018-10-19 DIAGNOSIS — S0990XA Unspecified injury of head, initial encounter: Secondary | ICD-10-CM

## 2018-10-19 DIAGNOSIS — Y999 Unspecified external cause status: Secondary | ICD-10-CM | POA: Insufficient documentation

## 2018-10-19 DIAGNOSIS — S0101XA Laceration without foreign body of scalp, initial encounter: Secondary | ICD-10-CM | POA: Insufficient documentation

## 2018-10-19 DIAGNOSIS — F17290 Nicotine dependence, other tobacco product, uncomplicated: Secondary | ICD-10-CM | POA: Insufficient documentation

## 2018-10-19 DIAGNOSIS — Y939 Activity, unspecified: Secondary | ICD-10-CM | POA: Insufficient documentation

## 2018-10-19 DIAGNOSIS — Z5321 Procedure and treatment not carried out due to patient leaving prior to being seen by health care provider: Secondary | ICD-10-CM

## 2018-10-19 DIAGNOSIS — Z79899 Other long term (current) drug therapy: Secondary | ICD-10-CM | POA: Insufficient documentation

## 2018-10-19 DIAGNOSIS — W228XXA Striking against or struck by other objects, initial encounter: Secondary | ICD-10-CM | POA: Insufficient documentation

## 2018-10-19 NOTE — ED Notes (Signed)
Patient left department before signature for discharge could be obtained.
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
## 2019-08-22 ENCOUNTER — Emergency Department (HOSPITAL_COMMUNITY): Payer: Self-pay

## 2019-08-22 ENCOUNTER — Encounter (HOSPITAL_COMMUNITY): Payer: Self-pay | Admitting: Obstetrics and Gynecology

## 2019-08-22 ENCOUNTER — Other Ambulatory Visit: Payer: Self-pay

## 2019-08-22 ENCOUNTER — Emergency Department (HOSPITAL_COMMUNITY)
Admission: EM | Admit: 2019-08-22 | Discharge: 2019-08-22 | Disposition: A | Discharge status: Home / Self Care | Payer: Self-pay | Attending: Emergency Medicine | Admitting: Emergency Medicine

## 2019-08-22 DIAGNOSIS — F17200 Nicotine dependence, unspecified, uncomplicated: Secondary | ICD-10-CM | POA: Insufficient documentation

## 2019-08-22 DIAGNOSIS — Z79899 Other long term (current) drug therapy: Secondary | ICD-10-CM | POA: Insufficient documentation

## 2019-08-22 DIAGNOSIS — T401X1A Poisoning by heroin, accidental (unintentional), initial encounter: Secondary | ICD-10-CM | POA: Insufficient documentation

## 2019-08-22 LAB — BASIC METABOLIC PANEL
Anion gap: 11 (ref 5–15)
BUN: 17 mg/dL (ref 6–20)
CO2: 25 mmol/L (ref 22–32)
Calcium: 9 mg/dL (ref 8.9–10.3)
Chloride: 104 mmol/L (ref 98–111)
Creatinine, Ser: 0.66 mg/dL (ref 0.44–1.00)
GFR calc Af Amer: >60 mL/min (ref >60)
GFR calc non Af Amer: >60 mL/min (ref >60)
Glucose, Bld: 111 mg/dL — ABNORMAL HIGH (ref 70–99)
Potassium: 3.9 mmol/L (ref 3.5–5.1)
Sodium: 140 mmol/L (ref 135–145)

## 2019-08-22 LAB — I-STAT BETA HCG BLOOD, ED (MC, WL, AP ONLY): I-stat hCG, quantitative: <5 m[IU]/mL (ref <5)

## 2019-08-22 LAB — CBC WITH DIFFERENTIAL/PLATELET
Abs Immature Granulocytes: 0.04 10*3/uL (ref 0.00–0.07)
Basophils Absolute: 0.1 10*3/uL (ref 0.0–0.1)
Basophils Relative: 1 %
Eosinophils Absolute: 0.1 10*3/uL (ref 0.0–0.5)
Eosinophils Relative: 1 %
HCT: 40.6 % (ref 36.0–46.0)
Hemoglobin: 13.2 g/dL (ref 12.0–15.0)
Immature Granulocytes: 0 %
Lymphocytes Relative: 39 %
Lymphs Abs: 4.8 10*3/uL — ABNORMAL HIGH (ref 0.7–4.0)
MCH: 29.1 pg (ref 26.0–34.0)
MCHC: 32.5 g/dL (ref 30.0–36.0)
MCV: 89.4 fL (ref 80.0–100.0)
Monocytes Absolute: 1.1 10*3/uL — ABNORMAL HIGH (ref 0.1–1.0)
Monocytes Relative: 9 %
Neutro Abs: 6.2 10*3/uL (ref 1.7–7.7)
Neutrophils Relative %: 50 %
Platelets: 405 10*3/uL — ABNORMAL HIGH (ref 150–400)
RBC: 4.54 MIL/uL (ref 3.87–5.11)
RDW: 13.9 % (ref 11.5–15.5)
WBC: 12.4 10*3/uL — ABNORMAL HIGH (ref 4.0–10.5)
nRBC: 0 % (ref 0.0–0.2)

## 2019-08-22 MED ORDER — NALOXONE HCL 4 MG/0.1ML NA LIQD
1.0000 | Freq: Once | NASAL | Status: AC
  Administered 2019-08-22: 1 via NASAL
  Filled 2019-08-22: qty 4

## 2019-08-22 MED ORDER — SODIUM CHLORIDE 0.9 % IV BOLUS
1000.0000 mL | Freq: Once | INTRAVENOUS | Status: AC
  Administered 2019-08-22: 1000 mL via INTRAVENOUS

## 2019-08-22 NOTE — ED Notes (Signed)
Boyfriend at bedside. Patient calm and cooperative at this time.

## 2019-08-22 NOTE — Discharge Instructions (Signed)
There is help if you need it.  Please do not use dirty needles, this could cause you a severe infection to your skin, heart or spinal cord.  This could kill you or leave you permanently disabled.  There was a recent study done at [facility] that showed that the risk of death for someone that had unintentionally overdosed on narcotics was as high as 15% in the next year.  This is much higher than most every other medical condition.  [place] Solution to the Opioid Problem ([facility]STOP) Fixed; mobile; peer-based Chase Holleman [phone] [email or web address] Fixed site exchange at [employer], Wednesdays (2-5pm) and Thursdays (4-8pm). [address] [place], [place] [address] Call or text to arrange mobile and peer exchange, Mondays (1-4pm) and Fridays (4-7pm). Serving [place]  
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
## 2019-09-22 ENCOUNTER — Other Ambulatory Visit: Payer: Self-pay

## 2019-09-22 ENCOUNTER — Ambulatory Visit (INDEPENDENT_AMBULATORY_CARE_PROVIDER_SITE_OTHER): Payer: No Payment, Other | Admitting: Psychiatry

## 2019-09-22 ENCOUNTER — Encounter (HOSPITAL_COMMUNITY): Payer: Self-pay | Admitting: Psychiatry

## 2019-09-22 ENCOUNTER — Ambulatory Visit (INDEPENDENT_AMBULATORY_CARE_PROVIDER_SITE_OTHER): Payer: No Payment, Other | Admitting: Licensed Clinical Social Worker

## 2019-09-22 ENCOUNTER — Encounter (HOSPITAL_COMMUNITY): Payer: Self-pay | Admitting: Licensed Clinical Social Worker

## 2019-09-22 DIAGNOSIS — F1910 Other psychoactive substance abuse, uncomplicated: Secondary | ICD-10-CM

## 2019-09-22 DIAGNOSIS — F4310 Post-traumatic stress disorder, unspecified: Secondary | ICD-10-CM

## 2019-09-22 DIAGNOSIS — F419 Anxiety disorder, unspecified: Secondary | ICD-10-CM

## 2019-09-22 MED ORDER — HYDROXYZINE HCL 10 MG PO TABS: 10.0000 mg | ORAL_TABLET | Freq: Three times a day (TID) | ORAL | 1 refills | Status: DC | PRN
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
## 2019-09-22 NOTE — Patient Instructions (Signed)
Managing Post-Traumatic Stress Disorder If you have been diagnosed with post-traumatic stress disorder (PTSD), you may be relieved that you now know why you have felt or behaved a certain way. Still, you may feel overwhelmed about the treatment ahead. You may also wonder how to get the support you need and how to deal with the condition day-to-day. If you are living with PTSD, there are ways to help you recover from it and manage your symptoms. How to manage lifestyle changes Managing stress Stress is your body's reaction to life changes and events, both good and bad. Stress can make PTSD worse. Take the following steps to manage stress:  Talk with your health care provider or a counselor if you would like to learn more about techniques to reduce your stress. He or she may suggest some stress reduction techniques such as: ? Muscle relaxation exercises. ? Regular exercise. ? Meditation, yoga, or other mind-body exercises. ? Breathing exercises. ? Listening to quiet music. ? Spending time outside.  Maintain a healthy lifestyle. Eat a healthy diet, exercise regularly, get plenty of sleep, and take time to relax.  Spend time with others. Talk with them about how you are feeling and what kind of support you need. Try not to isolate yourself, even though you may feel like doing that. Isolating yourself can delay your recovery.  Do activities and hobbies that you enjoy.  Pace yourself when doing stressful things. Take breaks, and reward yourself when you finish. Make sure that you do not overload your schedule.  Medicines Your health care provider may suggest certain medicines if he or she feels that they will help to improve your condition. Medicines for depression (antidepressants) or severe loss of contact with reality (antipsychotics) may be used to treat PTSD. Avoid using alcohol and other substances that may prevent your medicines from working properly. It is also important to:  Talk with  your pharmacist or health care provider about all medicines that you take, their possible side effects, and which medicines are safe to take together.  Make it your goal to take part in all treatment decisions (shared decision-making). Ask about possible side effects of medicines that your health care provider recommends, and tell him or her how you feel about having those side effects. It is best if shared decision-making with your health care provider is part of your total treatment plan. If your health care provider prescribes a medicine, you may not notice the full benefits of it for 4-8 weeks. Most people who are treated for PTSD need to take medicine for at least 6-12 months after they feel better. If you are taking medicines as part of your treatment, do not stop taking medicines before you ask your health care provider if it is safe to stop. You may need to have the medicine slowly decreased (tapered) over time to lower the risk of harmful side effects. Relationships Many people who have PTSD have difficulty trusting others. Make an effort to:  Take risks and develop trust with close friends and family members. Developing trust in others can help you feel safe and connect you with emotional support.  Be open and honest about your feelings.  Have fun and relax in safe spaces, such as with friends and family.  Think about going to couples counseling, family education classes, or family therapy. Your loved ones may not always know how to be supportive. Therapy can be helpful for everyone. How to recognize changes in your condition Be aware of your   symptoms and how often you have them. The following symptoms mean that you need to seek help for your PTSD:  You feel suspicious and angry.  You have repeated flashbacks.  You avoid going out or being with others.  You have an increasing number of fights with close friends or family members, such as your spouse.  You have thoughts about  hurting yourself or others.  You cannot get relief from feelings of depression or anxiety. Follow these instructions at home: Lifestyle  Exercise regularly. Try to do 30 or more minutes of physical activity on most days of the week.  Try to get 7-9 hours of sleep each night. To help with sleep: ? Keep your bedroom cool and dark. ? Avoid screen time before bedtime. This means avoiding use of your TV, computer, tablet, and cell phone.  Practice self-soothing skills and use them daily.  Try to have fun and seek humor in your life. Eating and drinking  Do not eat a heavy meal during the hour before you go to bed.  Do not drink alcohol or caffeinated drinks before bed.  Avoid using alcohol or drugs. General instructions  If your PTSD is affecting your marriage or family, seek help from a family therapist.  Take over-the-counter and prescription medicines only as told by your health care provider.  Make sure to let all of your health care providers know that you have PTSD. This is especially important if you are having surgery or need to be admitted to the hospital.  Keep all follow-up visits as told by your health care providers. This is important. Where to find support Talking to others  Explain that PTSD is a mental health problem. It is something that a person can develop after experiencing or seeing a life-threatening event. Tell them that PTSD makes you feel stress like you did during the event.  Talk to your loved ones about the symptoms you have. Also tell them what things or situations can cause symptoms to start (are triggers for you).  Assure your loved ones that there are treatments to help PTSD. Discuss possibly seeking family therapy or couples therapy.  If you are worried or fearful about seeking treatment, ask for support.  Keep daily contact with at least one trusted friend or family member. Finances Not all insurance plans cover mental health care, so it is  important to check with your insurance carrier. If paying for co-pays or counseling services is a problem, search for a local or county mental health care center. Public mental health care services may be offered there at a low cost or no cost when you are not able to see a private health care provider. If you are a veteran, contact a local veterans organization or veterans hospital for more information. If you are taking medicine for PTSD, you may be able to get the genericform, which may be less expensive than brand-name medicine. Some makers of prescription medicines also offer help to patients who cannot afford the medicines that they need. Therapy and support groups  Find a support group in your community. Often, groups are available for military veterans, trauma victims, and family members or caregivers.  Look into volunteer opportunities. Taking part in these can help you feel more connected to your community.  Contact a local organization to find out if you are eligible for a service dog. Where to find more information Go to this website to find more information about PTSD, treatment of PTSD, and how to   get support:  National Center for PTSD: www.ptsd.va.gov Contact a health care provider if:  Your symptoms get worse or do not get better. Get help right away if:  You have thoughts about hurting yourself or others. If you ever feel like you may hurt yourself or others, or have thoughts about taking your own life, get help right away. You can go to your nearest emergency department or call:  Your local emergency services (911 in the [place]  A suicide crisis helpline, such as the National Suicide Prevention Lifeline at 1-800-273-8255. This is open 24-hours a day. Summary  If you are living with PTSD, there are ways to help you recover from it and manage your symptoms.  Find supportive environments and people who understand PTSD. Spend time in those places, and maintain contact with  those people.  Work with your health care team to create a plan for managing PTSD. The plan should include counseling, stress reduction techniques, and healthy lifestyle habits. This information is not intended to replace advice given to you by your health care provider. Make sure you discuss any questions you have with your health care provider. Document Revised: 07/17/2018 Document Reviewed: 07/25/2016 Elsevier Patient Education  2020 [employer]  

## 2019-10-06 ENCOUNTER — Ambulatory Visit (HOSPITAL_COMMUNITY): Payer: No Typology Code available for payment source | Admitting: Licensed Clinical Social Worker

## 2019-10-25 ENCOUNTER — Ambulatory Visit (HOSPITAL_COMMUNITY): Payer: Self-pay | Admitting: Licensed Clinical Social Worker

## 2019-10-26 ENCOUNTER — Encounter (HOSPITAL_COMMUNITY): Payer: No Typology Code available for payment source | Admitting: Psychiatry

## 2019-11-18 ENCOUNTER — Ambulatory Visit (HOSPITAL_COMMUNITY): Payer: Self-pay | Admitting: Licensed Clinical Social Worker

## 2019-12-30 ENCOUNTER — Encounter (HOSPITAL_COMMUNITY): Payer: No Payment, Other | Admitting: Psychiatry

## 2020-02-02 ENCOUNTER — Other Ambulatory Visit: Payer: Self-pay

## 2020-02-02 ENCOUNTER — Encounter (HOSPITAL_COMMUNITY): Payer: Self-pay | Admitting: Licensed Clinical Social Worker

## 2020-02-02 ENCOUNTER — Ambulatory Visit (HOSPITAL_COMMUNITY): Payer: No Payment, Other | Admitting: Licensed Clinical Social Worker

## 2020-02-02 DIAGNOSIS — Z8659 Personal history of other mental and behavioral disorders: Secondary | ICD-10-CM
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
## 2020-03-21 ENCOUNTER — Ambulatory Visit (INDEPENDENT_AMBULATORY_CARE_PROVIDER_SITE_OTHER): Payer: No Payment, Other | Admitting: Licensed Clinical Social Worker

## 2020-03-21 ENCOUNTER — Other Ambulatory Visit: Payer: Self-pay

## 2020-03-21 DIAGNOSIS — F411 Generalized anxiety disorder: Secondary | ICD-10-CM | POA: Diagnosis not present
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
## 2020-05-30 ENCOUNTER — Ambulatory Visit (HOSPITAL_COMMUNITY): Payer: No Payment, Other | Admitting: Licensed Clinical Social Worker

## 2020-05-30 ENCOUNTER — Telehealth (HOSPITAL_COMMUNITY): Payer: Self-pay | Admitting: Licensed Clinical Social Worker

## 2020-05-30 ENCOUNTER — Other Ambulatory Visit: Payer: Self-pay

## 2020-05-30 NOTE — Telephone Encounter (Signed)
LCSW sent two links to pt phone then f/u with a PC. LCSW left VM for pt for reschedule.

## 2021-01-11 ENCOUNTER — Encounter (HOSPITAL_COMMUNITY): Payer: Self-pay

## 2021-01-11 ENCOUNTER — Emergency Department (HOSPITAL_COMMUNITY)
Admission: EM | Admit: 2021-01-11 | Discharge: 2021-01-12 | Disposition: A | Discharge status: Home / Self Care | Payer: No Payment, Other | Attending: Emergency Medicine | Admitting: Emergency Medicine

## 2021-01-11 ENCOUNTER — Other Ambulatory Visit: Payer: Self-pay

## 2021-01-11 DIAGNOSIS — F29 Unspecified psychosis not due to a substance or known physiological condition: Secondary | ICD-10-CM | POA: Insufficient documentation

## 2021-01-11 DIAGNOSIS — W540XXA Bitten by dog, initial encounter: Secondary | ICD-10-CM | POA: Insufficient documentation

## 2021-01-11 DIAGNOSIS — N9489 Other specified conditions associated with female genital organs and menstrual cycle: Secondary | ICD-10-CM | POA: Insufficient documentation

## 2021-01-11 DIAGNOSIS — F17210 Nicotine dependence, cigarettes, uncomplicated: Secondary | ICD-10-CM | POA: Insufficient documentation

## 2021-01-11 DIAGNOSIS — Z79899 Other long term (current) drug therapy: Secondary | ICD-10-CM | POA: Insufficient documentation

## 2021-01-11 DIAGNOSIS — F1999 Other psychoactive substance use, unspecified with unspecified psychoactive substance-induced disorder: Secondary | ICD-10-CM | POA: Diagnosis present

## 2021-01-11 DIAGNOSIS — S7011XA Contusion of right thigh, initial encounter: Secondary | ICD-10-CM | POA: Insufficient documentation

## 2021-01-11 DIAGNOSIS — F1510 Other stimulant abuse, uncomplicated: Secondary | ICD-10-CM | POA: Diagnosis present

## 2021-01-11 DIAGNOSIS — F1910 Other psychoactive substance abuse, uncomplicated: Secondary | ICD-10-CM | POA: Diagnosis present

## 2021-01-11 DIAGNOSIS — F1410 Cocaine abuse, uncomplicated: Secondary | ICD-10-CM | POA: Diagnosis present

## 2021-01-11 LAB — COMPREHENSIVE METABOLIC PANEL
ALT: 9 U/L (ref 0–44)
AST: 23 U/L (ref 15–41)
Albumin: 4.4 g/dL (ref 3.5–5.0)
Alkaline Phosphatase: 69 U/L (ref 38–126)
Anion gap: 7 (ref 5–15)
BUN: 11 mg/dL (ref 6–20)
CO2: 24 mmol/L (ref 22–32)
Calcium: 9.4 mg/dL (ref 8.9–10.3)
Chloride: 104 mmol/L (ref 98–111)
Creatinine, Ser: 0.76 mg/dL (ref 0.44–1.00)
GFR, Estimated: >60 mL/min (ref >60)
Glucose, Bld: 179 mg/dL — ABNORMAL HIGH (ref 70–99)
Potassium: 4.3 mmol/L (ref 3.5–5.1)
Sodium: 135 mmol/L (ref 135–145)
Total Bilirubin: 0.3 mg/dL (ref 0.3–1.2)
Total Protein: 8.4 g/dL — ABNORMAL HIGH (ref 6.5–8.1)

## 2021-01-11 LAB — CBC
HCT: 42.6 % (ref 36.0–46.0)
Hemoglobin: 13.7 g/dL (ref 12.0–15.0)
MCH: 30.1 pg (ref 26.0–34.0)
MCHC: 32.2 g/dL (ref 30.0–36.0)
MCV: 93.6 fL (ref 80.0–100.0)
Platelets: 363 10*3/uL (ref 150–400)
RBC: 4.55 MIL/uL (ref 3.87–5.11)
RDW: 13.1 % (ref 11.5–15.5)
WBC: 16 10*3/uL — ABNORMAL HIGH (ref 4.0–10.5)
nRBC: 0 % (ref 0.0–0.2)

## 2021-01-11 LAB — ETHANOL: Alcohol, Ethyl (B): <10 mg/dL (ref <10)

## 2021-01-11 LAB — RAPID URINE DRUG SCREEN, HOSP PERFORMED
Amphetamines: POSITIVE — AB
Barbiturates: NOT DETECTED
Benzodiazepines: NOT DETECTED
Cocaine: NOT DETECTED
Opiates: POSITIVE — AB
Tetrahydrocannabinol: NOT DETECTED

## 2021-01-11 LAB — I-STAT BETA HCG BLOOD, ED (MC, WL, AP ONLY): I-stat hCG, quantitative: <5 m[IU]/mL (ref <5)

## 2021-01-11 MED ORDER — ONDANSETRON 4 MG PO TBDP
4.0000 mg | ORAL_TABLET | Freq: Once | ORAL | Status: AC
  Administered 2021-01-11: 4 mg via ORAL
  Filled 2021-01-11: qty 1

## 2021-01-11 NOTE — ED Notes (Signed)
Pt changed into burgundy scrubs. Pt has 2 personal belonging bags that are labeled, placed in cabinets: 19-22, Allison Woodard. Pt has her clothes (pants, shirt, coat) in one bag, and her bag, phone and shoes in the other bag. Security notified to wand pt.

## 2021-01-11 NOTE — ED Triage Notes (Signed)
Pt to ED via [employer] IVC'd.  Family states pt has been threatening towards them.  Pt uses heroin and meth daily and has overdosed twice this week although unknown if it was intentional.
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
## 2021-01-12 DIAGNOSIS — F1999 Other psychoactive substance use, unspecified with unspecified psychoactive substance-induced disorder: Secondary | ICD-10-CM | POA: Diagnosis present

## 2021-01-12 DIAGNOSIS — F1510 Other stimulant abuse, uncomplicated: Secondary | ICD-10-CM | POA: Diagnosis present

## 2021-01-12 DIAGNOSIS — F1410 Cocaine abuse, uncomplicated: Secondary | ICD-10-CM | POA: Diagnosis present

## 2021-01-12 DIAGNOSIS — F1910 Other psychoactive substance abuse, uncomplicated: Secondary | ICD-10-CM | POA: Diagnosis not present

## 2021-01-12 NOTE — ED Notes (Signed)
Patient was given her breakfast tray.

## 2021-01-12 NOTE — ED Provider Notes (Addendum)
Emergency Medicine Observation Re-evaluation Note  Allison Woodard is a 30 y.o. female, seen on rounds today.  Pt initially presented to the ED for complaints of Suicidal Currently, the patient is resting comfortably.  Physical Exam  BP 99/63 (BP Location: Right Arm)   Pulse 85   Temp 97.9 F (36.6 C)   Resp 16   Ht 5' 2.5" (1.588 m)   Wt 59 kg   SpO2 100%   BMI 23.41 kg/m  Physical Exam General: Cooperative and calm Cardiac: Normal heart rate Lungs: Normal respiratory Psych: Not responding to internal stimuli  ED Course / MDM  EKG:   I have reviewed the labs performed to date as well as medications administered while in observation.  Recent changes in the last 24 hours include she has been cleared by psychiatry.  Currently awaiting CPS evaluation and intervention due to using drugs in presence of young child.  Plan  Current plan is for [facility] evaluation with protective services.  I have rescinded her IVC.  Allison Woodard is not under involuntary commitment.     Mancel Bale, MD 01/12/21 1034  She has been seen and cleared for discharge by TOC, without intervention.    Mancel Bale, MD 01/12/21 [phone]

## 2021-01-12 NOTE — Consult Note (Signed)
[facility] Face-to-Face Psychiatry Consult   Reason for Consult:  Erratic behavior, drug abuse Referring Physician:  Theotis Barrio, MD Patient Identification: Allison Woodard MRN:  [id number] Principal Diagnosis: Substance-induced disorder [facility]) Diagnosis:  Principal Problem:   Substance-induced disorder (HCC) Active Problems:   Polysubstance abuse (HCC)   Cocaine abuse (HCC)   Amphetamine abuse (HCC)   Total Time spent with patient: 20 minutes  Subjective:   Allison Woodard is a 30 y.o. female patient admitted via IVC by her mother for erratic behavior and drug abuse.   On assessment patient presents calm and cooperative, alert and oriented x 2-3. Denies any knowledge of the reason she was IVC'd states she was bitten by a neighbors dog and few days ago and refused to go to the hospital, says her mother "just wants me in the hospital". She is currently minimizing drug use, denying consistent use and any use in front of her son. Declines any substance abuse treatment at this time.   She denies any suicidal or homicidal ideations, auditory or visual hallucinations, and does not appear to be responding to any external/internal stimuli at this time. Patient provided verbal permission to speak to mother/petitioner for collateral information.   Collateral: Taylee Gunnells (mom) [phone]  Contacted:1010, 1015, 1025, 1030, 1115, and 1125; no answer  HPI:  Allison Woodard is a 30 year old female who presented to [facility] via [facility] under IVC for reported erratic behavior and drug abuse. Per chart review, patient has history of severe opioid use disorder, generalized anxiety disorder, PTSD, polysubstance abuse, and [facility] admission (09/2018). UDS+cocaine, amphetamines; BAL <10. PDMP reviewed, last suboxone rx dispensed 09/29/20.   Past Psychiatric History: severe opioid use disorder, generalized anxiety disorder, PTSD, polysubstance abuse  Risk to Self:  pt denies Risk to Others:  pt denies Prior Inpatient Therapy:   yes Prior Outpatient Therapy:  yes  Past Medical History: History reviewed. No pertinent past medical history. History reviewed. No pertinent surgical history. Family History: History reviewed. No pertinent family history. Family Psychiatric  History: not noted Social History:  Social History   Substance and Sexual Activity  Alcohol Use Not Currently     Social History   Substance and Sexual Activity  Drug Use Yes   Frequency: 7.0 times per week   Types: IV, Methamphetamines    Social History   Socioeconomic History   Marital status: Single    Spouse name: Not on file   Number of children: Not on file   Years of education: Not on file   Highest education level: Not on file  Occupational History   Not on file  Tobacco Use   Smoking status: Every Day    Types: E-cigarettes   Smokeless tobacco: Current  Substance and Sexual Activity   Alcohol use: Not Currently   Drug use: Yes    Frequency: 7.0 times per week    Types: IV, Methamphetamines   Sexual activity: Yes  Other Topics Concern   Not on file  Social History Narrative   Not on file   Social Determinants of Health   Financial Resource Strain: Not on file  Food Insecurity: No Food Insecurity   Worried About Programme researcher, broadcasting/film/video in the Last Year: Never true   Ran Out of Food in the Last Year: Never true  Transportation Needs: Not on file  Physical Activity: Not on file  Stress: No Stress Concern Present   Feeling of Stress : Not at all  Social Connections: Moderately Isolated  Frequency of Communication with Friends and Family: More than three times a week   Frequency of Social Gatherings with Friends and Family: Twice a week   Attends Religious Services: 1 to 4 times per year   Active Member of [employer] or Organizations: No   Attends Banker Meetings: Never   Marital Status: Never married   Additional Social History:    Allergies:   Allergies  Allergen Reactions   Other     Pt got Itchy,  sneezing when contacted with cat (animal)    Labs:  Results for orders placed or performed during the hospital encounter of 01/11/21 (from the past 48 hour(s))  Rapid urine drug screen (hospital performed)     Status: Abnormal   Collection Time: 01/11/21 10:19 PM  Result Value Ref Range   Opiates POSITIVE (A) NONE DETECTED   Cocaine NONE DETECTED NONE DETECTED   Benzodiazepines NONE DETECTED NONE DETECTED   Amphetamines POSITIVE (A) NONE DETECTED   Tetrahydrocannabinol NONE DETECTED NONE DETECTED   Barbiturates NONE DETECTED NONE DETECTED    Comment: Performed at [facility], [address], [place], [place] [address]  Comprehensive metabolic panel     Status: Abnormal   Collection Time: 01/11/21 11:14 PM  Result Value Ref Range   Sodium 135 135 - 145 mmol/L   Potassium 4.3 3.5 - 5.1 mmol/L   Chloride 104 98 - 111 mmol/L   CO2 24 22 - 32 mmol/L   Glucose, Bld 179 (H) 70 - 99 mg/dL    Comment: Glucose reference range applies only to samples taken after fasting for at least 8 hours.   BUN 11 6 - 20 mg/dL   Creatinine, Ser 8.92 0.44 - 1.00 mg/dL   Calcium 9.4 8.9 - 11.9 mg/dL   Total Protein 8.4 (H) 6.5 - 8.1 g/dL   Albumin 4.4 3.5 - 5.0 g/dL   AST 23 15 - 41 U/L   ALT 9 0 - 44 U/L   Alkaline Phosphatase 69 38 - 126 U/L   Total Bilirubin 0.3 0.3 - 1.2 mg/dL   GFR, Estimated >41 >74 mL/min    Comment: (NOTE) Calculated using the CKD-EPI Creatinine Equation (2021)    Anion gap 7 5 - 15    Comment: Performed at [facility], [address], [place], [place] [address]  cbc     Status: Abnormal   Collection Time: 01/11/21 11:14 PM  Result Value Ref Range   WBC 16.0 (H) 4.0 - 10.5 K/uL   RBC 4.55 3.87 - 5.11 MIL/uL   Hemoglobin 13.7 12.0 - 15.0 g/dL   HCT 81.8 56.3 - 14.9 %   MCV 93.6 80.0 - 100.0 fL   MCH 30.1 26.0 - 34.0 pg   MCHC 32.2 30.0 - 36.0 g/dL   RDW 70.2 63.7 - 85.8 %   Platelets 363 150 - 400 K/uL   nRBC 0.0 0.0 - 0.2 %     Comment: Performed at [facility], [address], [place], [place] [address]  Ethanol     Status: None   Collection Time: 01/11/21 11:15 PM  Result Value Ref Range   Alcohol, Ethyl (B) <10 <10 mg/dL    Comment: (NOTE) Lowest detectable limit for serum alcohol is 10 mg/dL.  For medical purposes only. Performed at [facility], [address], [place], [place] [address]   I-Stat beta hCG blood, ED     Status: None   Collection Time: 01/11/21  11:21 PM  Result Value Ref Range   I-stat hCG, quantitative <5.0 <5 mIU/mL   Comment 3            Comment:   GEST. AGE      CONC.  (mIU/mL)   <=1 WEEK        5 - 50     2 WEEKS       50 - 500     3 WEEKS       100 - 10,000     4 WEEKS     1,000 - 30,000        FEMALE AND NON-PREGNANT FEMALE:     LESS THAN 5 mIU/mL     No current facility-administered medications for this encounter.   Current Outpatient Medications  Medication Sig Dispense Refill   acetaminophen (TYLENOL) 325 MG tablet Take 650 mg by mouth every 6 (six) hours as needed for headache.     gabapentin (NEURONTIN) 300 MG capsule Take 1 capsule (300 mg total) by mouth 3 (three) times daily. 90 capsule 2   hydrOXYzine (ATARAX/VISTARIL) 10 MG tablet Take 1 tablet (10 mg total) by mouth 3 (three) times daily as needed for anxiety. (Patient not taking: Reported on 01/11/2021) 60 tablet 1   QUEtiapine (SEROQUEL) 50 MG tablet Take 1 tablet (50 mg total) by mouth 3 (three) times daily for 15 days. 45 tablet 1    Musculoskeletal: Strength & Muscle Tone: within normal limits Gait & Station: normal Patient leans: N/A  Psychiatric Specialty Exam:  Presentation  General Appearance:  Casual Eye Contact: Fair Speech: Clear and Coherent Speech Volume: Normal Handedness: No data recorded  Mood and Affect  Mood: Dysphoric Affect: Blunt  Thought Process  Thought Processes: Goal Directed Descriptions of  Associations:Intact Orientation:Partial Thought Content:Logical History of Schizophrenia/Schizoaffective disorder:No data recorded Duration of Psychotic Symptoms:No data recorded Hallucinations:Hallucinations: None Ideas of Reference:None Suicidal Thoughts:Suicidal Thoughts: No Homicidal Thoughts:Homicidal Thoughts: No  Sensorium  Memory: Immediate Fair; Recent Fair; Remote Fair Judgment: Fair Insight: Fair  Art therapist  Concentration: Fair Attention Span: Fair Recall: [employer] of Knowledge: Fair Language: Fair  Psychomotor Activity  Psychomotor Activity: Psychomotor Activity: Normal  Assets  Assets: Physical Health; Resilience  Sleep  Sleep: Sleep: Fair  Physical Exam: Physical Exam Vitals and nursing note reviewed.  Constitutional:      General: She is not in acute distress.    Appearance: She is not ill-appearing or toxic-appearing.  HENT:     Head: Normocephalic.     Nose: Nose normal.     Mouth/Throat:     Mouth: Mucous membranes are moist.     Pharynx: Oropharynx is clear.  Eyes:     Pupils: Pupils are equal, round, and reactive to light.  Cardiovascular:     Rate and Rhythm: Normal rate.     Pulses: Normal pulses.  Pulmonary:     Effort: Pulmonary effort is normal.  Abdominal:     General: Abdomen is flat.  Musculoskeletal:        General: Normal range of motion.     Cervical back: Normal range of motion.  Skin:    General: Skin is warm and dry.  Neurological:     General: No focal deficit present.     Mental Status: She is alert.  Psychiatric:        Attention and Perception: Attention and perception normal. She does not perceive auditory or visual hallucinations.        Mood and Affect:  Affect is blunt and flat.        Speech: Speech normal.        Behavior: Behavior is withdrawn. Behavior is cooperative.        Thought Content: Thought content does not include homicidal or suicidal ideation. Thought content does not  include homicidal or suicidal plan.        Cognition and Memory: Cognition and memory normal.   Review of Systems  Skin:  Positive for rash.  Psychiatric/Behavioral:  Positive for substance abuse. Negative for hallucinations and suicidal ideas.   All other systems reviewed and are negative. Blood pressure 99/63, pulse 85, temperature 97.9 F (36.6 C), resp. rate 16, height 5' 2.5" (1.588 m), weight 59 kg, SpO2 100 %. Body mass index is 23.41 kg/m.  Treatment Plan Summary: Plan Discharge patient with substance abuse resources for individual follow-up.  TOC order placed for CPS report based on information in IVC.   Disposition: No evidence of imminent risk to self or others at present.   Patient does not meet criteria for psychiatric inpatient admission. Supportive therapy provided about ongoing stressors. Discussed crisis plan, support from social network, calling 911, coming to the Emergency Department, and calling Suicide Hotline.  Loletta Parish, NP 01/12/2021 11:33 AM

## 2021-01-12 NOTE — ED Notes (Signed)
Pt asleep in bed, easy chest rise and fall

## 2021-01-12 NOTE — ED Notes (Signed)
Pt unable to sign for d/c due to being in hall bed and no signature pad available. Pt voiced understanding of d/c

## 2021-01-12 NOTE — Progress Notes (Signed)
Post d/c 4:40pm CSW received call from CPS. It was reported pt does not have custody of her child. CPS reported pt's mother has custodial custody. CPS stated they are in contact with pt's mother. CSW provided update on pt's d/c .  Valentina Shaggy.Nayda Riesen, MSW, LCSWA [facility]  Transitions of Care Clinical Social Worker I Direct Dial: [phone]  Fax: [phone] [email or web address]

## 2021-01-12 NOTE — Discharge Instructions (Signed)
For your behavioral health needs you are advised to resume treatment at [facility] at your earliest opportunity:      [facility]      [address]      [place], [place] [address]      [phone]      You may need to re-initiate treatment through the walk-in clinic.  Walk-in hours are Monday - Thursday from 8:00 am - 11:00 am for psychiatry, and Friday from 1:00 pm - 4:00 pm for therapy.  Walk-in patients are seen on a first come, first served basis, so try to arrive as early as possible for the best chance of being seen the same day.  Please note that to be eligible for services you must bring an ID or a piece of mail with your name and a [facility] address.  To help you maintain a sober lifestyle, a substance abuse treatment program may be beneficial to you.  Contact one of the following providers at your earliest opportunity to ask about enrolling their program:  RESIDENTIAL PROGRAMS:       [facility]      [address] [place], [place] [address]      [phone]       [facility]      [address] [place], [place] [address]      [phone]       [facility]      [address]      [place], [place] [address]      [phone]       [facility]      [address] [place], [place] [address]      [phone]       [facility]      [address]      [place], [place] [address]      [phone]  OUTPATIENT SUBSTANCE USE DISORDER COUNSELING:       [facility]      [address]      [place], [place] [address]      [phone]       New patients are seen at their walk-in clinic.  Walk-in hours are Monday - Friday from 8:30 am - 12:00 pm, and from 1:00 pm - 2:30 pm.  Walk-in patients are seen on a first come, first served basis, so try to arrive as early as possible for the best chance of being seen the same day.

## 2021-01-12 NOTE — ED Notes (Signed)
Pt provided 2 bags of clothes.

## 2021-01-12 NOTE — Progress Notes (Signed)
.  Transition of Care [facility]) - Emergency Department Mini Assessment   Patient Details  Name: Allison Woodard MRN: [id number] Date of Birth: 1990/12/09  Transition of Care [facility]) CM/SW Contact:    Larrie Kass, LCSW Phone Number: 01/12/2021, 12:11 PM   Clinical Narrative:  CSW was consulted due to concerns with pt's substance use and child. CSW spoke with pt. pt denies any substance abuse, even when CSW provided pt with her history of substance use. Pt reports, she used her friend's vape and didn't know it was mixed with substances. Pt stated her friends live down the street from pt's mother's home.  Pt reports having a child named Eli, pt stated she and the child live with pt's mother. Pt did not report the whereabouts of the child while she is in the hospital. Pt reports her mother is at work till 4 pm. CSW attempted to contact pt's mother for collateral, but no response left HIPPA compliant VM. Pt was guarded and did not want to speak with SW. pt stated she may need a ride home upon d/c. CPS report was made.   Valentina Shaggy.Shadee Montoya, MSW, LCSWA [facility]  Transitions of Care Clinical Social Worker I Direct Dial: [phone]  Fax: [phone] [email or web address]   ED Mini Assessment: What brought you to the Emergency Department? 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 [place] [address] Phone: [phone] Fax: [phone]

## 2021-01-12 NOTE — BH Assessment (Signed)
[facility] Assessment Progress Note   Per Maxie Barb, NP, this pt does not require psychiatric hospitalization at this time.  Pt presents under IVC initiated by pt's mother which has been rescinded by EDP Mancel Bale, MD.  Pt is psychiatrically cleared.  Discharge instructions include referrals for outpatient psychiatry, therapy and substance use disorder treatment.  Dr Effie Shy and pt's nurse, Lindie Spruce, have been notified.  Doylene Canning, MA Triage Specialist [phone]

## 2021-05-12 ENCOUNTER — Emergency Department (HOSPITAL_COMMUNITY)
Admission: EM | Admit: 2021-05-12 | Discharge: 2021-05-12 | Disposition: A | Discharge status: Home / Self Care | Payer: Self-pay | Attending: Emergency Medicine | Admitting: Emergency Medicine

## 2021-05-12 ENCOUNTER — Emergency Department (HOSPITAL_BASED_OUTPATIENT_CLINIC_OR_DEPARTMENT_OTHER): Payer: Self-pay

## 2021-05-12 ENCOUNTER — Emergency Department (HOSPITAL_COMMUNITY): Payer: Self-pay

## 2021-05-12 ENCOUNTER — Other Ambulatory Visit: Payer: Self-pay

## 2021-05-12 DIAGNOSIS — M79604 Pain in right leg: Secondary | ICD-10-CM | POA: Insufficient documentation

## 2021-05-12 DIAGNOSIS — S93491A Sprain of other ligament of right ankle, initial encounter: Secondary | ICD-10-CM | POA: Insufficient documentation

## 2021-05-12 DIAGNOSIS — X509XXA Other and unspecified overexertion or strenuous movements or postures, initial encounter: Secondary | ICD-10-CM | POA: Insufficient documentation

## 2021-05-12 LAB — CBC WITH DIFFERENTIAL/PLATELET
Abs Immature Granulocytes: 0.02 10*3/uL (ref 0.00–0.07)
Basophils Absolute: 0.1 10*3/uL (ref 0.0–0.1)
Basophils Relative: 2 %
Eosinophils Absolute: 0.7 10*3/uL — ABNORMAL HIGH (ref 0.0–0.5)
Eosinophils Relative: 10 %
HCT: 37.2 % (ref 36.0–46.0)
Hemoglobin: 11.6 g/dL — ABNORMAL LOW (ref 12.0–15.0)
Immature Granulocytes: 0 %
Lymphocytes Relative: 40 %
Lymphs Abs: 2.9 10*3/uL (ref 0.7–4.0)
MCH: 28.7 pg (ref 26.0–34.0)
MCHC: 31.2 g/dL (ref 30.0–36.0)
MCV: 92.1 fL (ref 80.0–100.0)
Monocytes Absolute: 0.6 10*3/uL (ref 0.1–1.0)
Monocytes Relative: 8 %
Neutro Abs: 3 10*3/uL (ref 1.7–7.7)
Neutrophils Relative %: 40 %
Platelets: 578 10*3/uL — ABNORMAL HIGH (ref 150–400)
RBC: 4.04 MIL/uL (ref 3.87–5.11)
RDW: 13.8 % (ref 11.5–15.5)
WBC: 7.4 10*3/uL (ref 4.0–10.5)
nRBC: 0 % (ref 0.0–0.2)

## 2021-05-12 LAB — BASIC METABOLIC PANEL
Anion gap: 6 (ref 5–15)
BUN: 10 mg/dL (ref 6–20)
CO2: 28 mmol/L (ref 22–32)
Calcium: 8.4 mg/dL — ABNORMAL LOW (ref 8.9–10.3)
Chloride: 101 mmol/L (ref 98–111)
Creatinine, Ser: 0.64 mg/dL (ref 0.44–1.00)
GFR, Estimated: >60 mL/min (ref >60)
Glucose, Bld: 92 mg/dL (ref 70–99)
Potassium: 3.3 mmol/L — ABNORMAL LOW (ref 3.5–5.1)
Sodium: 135 mmol/L (ref 135–145)

## 2021-05-12 LAB — D-DIMER, QUANTITATIVE: D-Dimer, Quant: 1.95 ug/mL-FEU — ABNORMAL HIGH (ref 0.00–0.50)

## 2021-05-12 NOTE — ED Notes (Signed)
Vas [place] in process.

## 2021-05-12 NOTE — Discharge Instructions (Addendum)
Your x-ray was negative for fracture of your ankle.  Your ultrasound that showed evidence of blood clot.  Your symptoms are most likely caused from a sprain of your right ankle.  This can take a few weeks to fully heal.  When you are at rest, elevate the ankle on some pillows, and apply ice for 15 to 20 minutes at a time few times a day.  You can use Tylenol Motrin as needed for pain relief.  I have also given you a boot and crutches to help you walk.  Follow-up with your primary care doctor if symptoms do not improve

## 2021-05-12 NOTE — ED Notes (Signed)
Ortho notified for CAM boot and crutches.  

## 2021-05-12 NOTE — ED Provider Notes (Signed)
[facility]-EMERGENCY DEPT Provider Note   CSN: [id number] Arrival date & time: 05/12/21  1207     History  Chief Complaint  Patient presents with   Ankle Pain    Allison AsaKayra Charney is a 31 y.o. female who presents to the ED for evaluation of right ankle pain and numbness.  Approximately 2 days ago, patient states she woke up and felt that her foot is completely numb as if it had fallen asleep.  She attempted to get up and walk and her foot gave out causing her to roll her ankle.  She has been pretty immobile since then due to pain.  She is also concerned that she continues to have the numbness and tingling at the foot and also endorses right posterior calf pain as well.  She is not on birth control but is a smoker.  She is taken Tylenol without significant improvement.  No alleviating factors.  She denies chest pain, shortness of breath and all other complaints.   Ankle Pain Associated symptoms: no fever       Home Medications Prior to Admission medications   Medication Sig Start Date End Date Taking? Authorizing Provider  acetaminophen (TYLENOL) 325 MG tablet Take 650 mg by mouth every 6 (six) hours as needed for headache.    [provider]  gabapentin (NEURONTIN) 300 MG capsule Take 1 capsule (300 mg total) by mouth 3 (three) times daily. 09/11/18   Malvin JohnsFarah, Brian, MD  hydrOXYzine (ATARAX/VISTARIL) 10 MG tablet Take 1 tablet (10 mg total) by mouth 3 (three) times daily as needed for anxiety. Patient not taking: Reported on 01/11/2021 09/22/19   Zena AmosKaur, Mandeep, MD  QUEtiapine (SEROQUEL) 50 MG tablet Take 1 tablet (50 mg total) by mouth 3 (three) times daily for 15 days. 09/11/18 09/26/18  Malvin JohnsFarah, Brian, MD      Allergies    Other    Review of Systems   Review of Systems  Constitutional:  Negative for fever.  HENT: Negative.    Eyes: Negative.   Respiratory:  Negative for shortness of breath.   Cardiovascular: Negative.   Gastrointestinal:  Negative for  abdominal pain and vomiting.  Endocrine: Negative.   Genitourinary: Negative.   Musculoskeletal:  Positive for arthralgias and joint swelling.  Skin:  Negative for rash.  Neurological:  Negative for headaches.  All other systems reviewed and are negative.  Physical Exam Updated Vital Signs BP 128/77 (BP Location: Right Arm)    Pulse 77    Temp 98.2 F (36.8 C) (Oral)    Resp 16    Ht 5\' 2"  (1.575 m)    Wt 54.4 kg    LMP 05/06/2021    SpO2 98%    BMI 21.95 kg/m  Physical Exam Vitals and nursing note reviewed.  Constitutional:      General: She is not in acute distress.    Appearance: She is not ill-appearing.  HENT:     Head: Atraumatic.  Eyes:     Conjunctiva/sclera: Conjunctivae normal.  Cardiovascular:     Rate and Rhythm: Normal rate and regular rhythm.     Pulses: Normal pulses.     Heart sounds: No murmur heard. Pulmonary:     Effort: Pulmonary effort is normal. No respiratory distress.     Breath sounds: Normal breath sounds.  Abdominal:     General: Abdomen is flat. There is no distension.     Palpations: Abdomen is soft.     Tenderness: There is no abdominal  tenderness.  Musculoskeletal:        General: Normal range of motion.     Cervical back: Normal range of motion.     Comments: Right ankle tender to touch just posterior to the lateral malleolus.  There is significant swelling of the ankle.  Diminished sensation to sharp and light touch in all 5 digits. Intact on left foot. She can slightly wiggle her toes though has difficulty.   TTP on posterior calf  Skin:    General: Skin is warm and dry.     Capillary Refill: Capillary refill takes less than 2 seconds.  Neurological:     General: No focal deficit present.     Mental Status: She is alert.  Psychiatric:        Mood and Affect: Mood normal.    ED Results / Procedures / Treatments   Labs (all labs ordered are listed, but only abnormal results are displayed) Labs Reviewed  BASIC METABOLIC PANEL -  Abnormal; Notable for the following components:      Result Value   Potassium 3.3 (*)    Calcium 8.4 (*)    All other components within normal limits  CBC WITH DIFFERENTIAL/PLATELET - Abnormal; Notable for the following components:   Hemoglobin 11.6 (*)    Platelets 578 (*)    Eosinophils Absolute 0.7 (*)    All other components within normal limits  D-DIMER, QUANTITATIVE - Abnormal; Notable for the following components:   D-Dimer, Quant 1.95 (*)    All other components within normal limits    EKG None  Radiology DG Ankle Complete Right  Result Date: 05/12/2021 CLINICAL DATA:  RIGHT ankle injury, swelling, pain. EXAM: RIGHT ANKLE - COMPLETE 3+ VIEW COMPARISON:  None. FINDINGS: Osseous alignment is normal. Ankle mortise is symmetric. No fracture line or displaced fracture fragment. Visualized portions of the hindfoot and midfoot appear intact and normally aligned. Soft tissue swelling over the lateral malleolus. IMPRESSION: Soft tissue swelling. No osseous fracture or dislocation. Electronically Signed   By: Bary RichardStan  Maynard M.D.   On: 05/12/2021 13:10   VAS US LOWER EXTREMITY VENOUS (DVT) (7a-7p)  Result Date: 05/12/2021  Lower Venous DVT Study Patient Name:  Allison AsaKAYRA Woodard  Date of Exam:   05/12/2021 Medical Rec #: [id number]     Accession #:    [id number] Date of Birth: February 04, 1991     Patient Gender: F Patient Age:   [age] years Exam [facility] Procedure:      VAS US LOWER EXTREMITY VENOUS (DVT) Referring Phys: Irma Delancey --------------------------------------------------------------------------------  Indications: Pain.  Comparison Study: no prior Performing Technologist: [place] PonderMegan Stricklin RVS  Examination Guidelines: A complete evaluation includes B-mode imaging, spectral Doppler, color Doppler, and power Doppler as needed of all accessible portions of each vessel. Bilateral testing is considered an integral part of a complete examination. Limited examinations for reoccurring  indications may be performed as noted. The reflux portion of the exam is performed with the patient in reverse Trendelenburg.  +---------+---------------+---------+-----------+----------+--------------+  RIGHT     Compressibility Phasicity Spontaneity Properties Thrombus Aging  +---------+---------------+---------+-----------+----------+--------------+  CFV       Full            Yes       Yes                                    +---------+---------------+---------+-----------+----------+--------------+  SFJ  Full                                                             +---------+---------------+---------+-----------+----------+--------------+  FV Prox   Full                                                             +---------+---------------+---------+-----------+----------+--------------+  FV Mid    Full                                                             +---------+---------------+---------+-----------+----------+--------------+  FV Distal Full                                                             +---------+---------------+---------+-----------+----------+--------------+  PFV       Full                                                             +---------+---------------+---------+-----------+----------+--------------+  POP       Full            Yes       Yes                                    +---------+---------------+---------+-----------+----------+--------------+  PTV       Full                                                             +---------+---------------+---------+-----------+----------+--------------+  PERO      Full                                                             +---------+---------------+---------+-----------+----------+--------------+   +----+---------------+---------+-----------+----------+--------------+  LEFT Compressibility Phasicity Spontaneity Properties Thrombus Aging  +----+---------------+---------+-----------+----------+--------------+  CFV  Full             Yes       Yes                                    +----+---------------+---------+-----------+----------+--------------+  Summary: RIGHT: - There is no evidence of deep vein thrombosis in the lower extremity.  - No cystic structure found in the popliteal fossa.  LEFT: - No evidence of common femoral vein obstruction.  *See table(s) above for measurements and observations.    Preliminary     Procedures Procedures  Medications Ordered in ED Medications - No data to display  ED Course/ Medical Decision Making/ A&P                           Medical Decision Making Amount and/or Complexity of Data Reviewed Labs: ordered. Radiology: ordered. ECG/medicine tests: ordered.   History:  Per HPI This patient presents to the ED for concern of ankle pain, this involves an extensive number of treatment options, and is a complaint that carries with it a high risk of complications and morbidity.   Ddx includes fractures, sprain, strain, DVT, PAD  Initial impression/ED course:  31 year old female in no apparent distress, nontoxic-appearing.  She has significant swelling of the right ankle and diminished sensation of the right foot.  I am concerned about the posterior calf pain in conjunction with the numbness and tingling for the last 2 days.  I will obtain x-ray of the foot in addition to basic labs and D-dimer. Negative x-ray, likely ankle sprain.  Will apply boot and provide crutches upon discharge.  However D-dimer was elevated.  Will obtain venous ultrasound prior to discharge.  Lab Tests and EKG:  I Ordered, reviewed, and interpreted labs and EKG.  The pertinent results in my decision-making regarding them are detailed in the ED course and/or initial impression section above.   Imaging Studies ordered:  I ordered imaging studies including x-ray of right ankle which shows no evidence of fracture, venous ultrasound negative for DVT I independently visualized and interpreted imaging  and I agree with the radiologist interpretation. Decisions made regarding results are detailed in the ED course and/or initial impression section above.  Disposition:  After consideration of the diagnostic results, physical exam, history and the patients response to treatment feel that the patent would benefit from discharge with strict return precautions.   Right ankle sprain: Patient discharged home with Ortho boot and crutches for mobility.  RICE protocol indicated.  Ultrasound was negative for DVT.  We discussed return precautions.  All questions were asked and answered.  Discharged home in good condition.   Final Clinical Impression(s) / ED Diagnoses Final diagnoses:  Sprain of posterior talofibular ligament of right ankle, initial encounter    Rx / DC Orders ED Discharge Orders     None         Janell Quiet, PA-C 05/12/21 1519    Wynetta Fines, MD 05/14/21 1626

## 2021-05-12 NOTE — Progress Notes (Signed)
Lower extremity venous has been completed.   Preliminary results in CV Proc.   Allison Woodard 05/12/2021 2:00 PM

## 2021-05-12 NOTE — ED Triage Notes (Signed)
Patient reports she rolled her right ankle one day ago when trying to walk. Says her foot was numb. Patient reports she is unable to feel toes now. Pain rated 10/10 in triage. Patient says she feels numb in right calf also. No redness, tenderness, swelling to calf. Patient has a couple of scratches on left leg, states she doesn't know where they came from but could be from her rose bushes. Denies cardiac history

## 2021-05-12 NOTE — ED Notes (Signed)
Secretary notified of VAS [place] LOWER EXTREMITY VENOUS (DVT) order.

## 2021-05-12 NOTE — Progress Notes (Signed)
Orthopedic Tech Progress Note Patient Details:  Allison Woodard 29-Jan-1991 [id number]  Ortho Devices Type of Ortho Device: Crutches, CAM walker Ortho Device/Splint Location: right Ortho Device/Splint Interventions: Application   Post Interventions Patient Tolerated: Well Instructions Provided: Care of device, Poper ambulation with device  Saul Fordyce 05/12/2021, 2:16 PM

## 2021-05-21 ENCOUNTER — Ambulatory Visit: Payer: Self-pay

## 2021-05-21 NOTE — Telephone Encounter (Signed)
°  Chief Complaint: foot numbness Symptoms: foot numbness all the way to calf, pain 10/10, unable to wiggle toes, swelling Frequency: for few days now Pertinent Negatives: NA Disposition: [x] ED /[] Urgent Care (no appt availability in office) / [] Appointment(In office/virtual)/ []  [facility]/ [] Home Care/ [] Refused Recommended Disposition /[]  Mobile Bus/ []  Follow-up with PCP Additional Notes: Advised pt to go back to ED and be re-evaluated. Pt states symptoms have gotten worse since going to [facility] ED on 05/12/21   Reason for Disposition  [1] Numbness (new loss of sensation) of toe(s) AND [2] present now  Answer Assessment - Initial Assessment Questions 1. MECHANISM: "How did the injury happen?" (e.g., twisting injury, direct blow)      Leg was asleep and took a step and felt pain in ankle  2. ONSET: "When did the injury happen?" (Minutes or hours ago)      05/10/21 3. LOCATION: "Where is the injury located?"      R ankle  4. APPEARANCE of INJURY: "What does the injury look like?"      Swelling and bruising  5. WEIGHT-BEARING: "Can you put weight on that foot?" "Can you walk (four steps or more)?"       Yes some weight  6. SIZE: For cuts, bruises, or swelling, ask: "How large is it?" (e.g., inches or centimeters;  entire joint)      Mild to moderate 7. PAIN: "Is there pain?" If Yes, ask: "How bad is the pain?"    (e.g., Scale 1-10; or mild, moderate, severe)   - NONE (0): no pain.   - MILD (1-3): doesn't interfere with normal activities.    - MODERATE (4-7): interferes with normal activities (e.g., work or school) or awakens from sleep, limping.    - SEVERE (8-10): excruciating pain, unable to do any normal activities, unable to walk.      Unable to walk on it  9. OTHER SYMPTOMS: "Do you have any other symptoms?"      Numbness all in feet and up to calf, unable to move toes  Protocols used: Ankle and Foot Injury-A-AH

## 2021-05-23 ENCOUNTER — Ambulatory Visit: Payer: Medicaid Other | Admitting: Registered Nurse

## 2021-09-05 ENCOUNTER — Emergency Department (HOSPITAL_COMMUNITY)
Admission: EM | Admit: 2021-09-05 | Discharge: 2021-09-05 | Disposition: A | Discharge status: Home / Self Care | Payer: Medicaid Other | Attending: Emergency Medicine | Admitting: Emergency Medicine

## 2021-09-05 DIAGNOSIS — R000 Tachycardia, unspecified: Secondary | ICD-10-CM

## 2021-09-05 DIAGNOSIS — R6889 Other general symptoms and signs: Secondary | ICD-10-CM

## 2021-09-05 LAB — RAPID URINE DRUG SCREEN, HOSP PERFORMED
Amphetamines: POSITIVE — AB
Barbiturates: NOT DETECTED
Benzodiazepines: NOT DETECTED
Cocaine: NOT DETECTED
Opiates: POSITIVE — AB
Tetrahydrocannabinol: NOT DETECTED

## 2021-09-05 LAB — COMPREHENSIVE METABOLIC PANEL
ALT: 13 U/L (ref 0–44)
AST: 31 U/L (ref 15–41)
Albumin: 4.5 g/dL (ref 3.5–5.0)
Alkaline Phosphatase: 92 U/L (ref 38–126)
Anion gap: 11 (ref 5–15)
BUN: 18 mg/dL (ref 6–20)
CO2: 27 mmol/L (ref 22–32)
Calcium: 10 mg/dL (ref 8.9–10.3)
Chloride: 99 mmol/L (ref 98–111)
Creatinine, Ser: 0.72 mg/dL (ref 0.44–1.00)
GFR, Estimated: >60 mL/min (ref >60)
Glucose, Bld: 121 mg/dL — ABNORMAL HIGH (ref 70–99)
Potassium: 4.3 mmol/L (ref 3.5–5.1)
Sodium: 137 mmol/L (ref 135–145)
Total Bilirubin: 0.6 mg/dL (ref 0.3–1.2)
Total Protein: 10 g/dL — ABNORMAL HIGH (ref 6.5–8.1)

## 2021-09-05 LAB — CBC WITH DIFFERENTIAL/PLATELET
Abs Immature Granulocytes: 0.03 10*3/uL (ref 0.00–0.07)
Basophils Absolute: 0.1 10*3/uL (ref 0.0–0.1)
Basophils Relative: 1 %
Eosinophils Absolute: 0.1 10*3/uL (ref 0.0–0.5)
Eosinophils Relative: 1 %
HCT: 46 % (ref 36.0–46.0)
Hemoglobin: 14.5 g/dL (ref 12.0–15.0)
Immature Granulocytes: 0 %
Lymphocytes Relative: 24 %
Lymphs Abs: 3 10*3/uL (ref 0.7–4.0)
MCH: 27.8 pg (ref 26.0–34.0)
MCHC: 31.5 g/dL (ref 30.0–36.0)
MCV: 88.3 fL (ref 80.0–100.0)
Monocytes Absolute: 0.8 10*3/uL (ref 0.1–1.0)
Monocytes Relative: 7 %
Neutro Abs: 8.4 10*3/uL — ABNORMAL HIGH (ref 1.7–7.7)
Neutrophils Relative %: 67 %
Platelets: 483 10*3/uL — ABNORMAL HIGH (ref 150–400)
RBC: 5.21 MIL/uL — ABNORMAL HIGH (ref 3.87–5.11)
RDW: 14.3 % (ref 11.5–15.5)
WBC: 12.5 10*3/uL — ABNORMAL HIGH (ref 4.0–10.5)
nRBC: 0 % (ref 0.0–0.2)

## 2021-09-05 LAB — I-STAT BETA HCG BLOOD, ED (MC, WL, AP ONLY): I-stat hCG, quantitative: <5 m[IU]/mL (ref <5)

## 2021-09-05 LAB — ETHANOL: Alcohol, Ethyl (B): <10 mg/dL (ref <10)

## 2021-09-05 LAB — SALICYLATE LEVEL: Salicylate Lvl: <7 mg/dL — ABNORMAL LOW (ref 7.0–30.0)

## 2021-09-05 LAB — ACETAMINOPHEN LEVEL: Acetaminophen (Tylenol), Serum: <10 ug/mL — ABNORMAL LOW (ref 10–30)

## 2021-09-05 NOTE — ED Triage Notes (Signed)
Ems brings pt in for possible overdose. Unknown substance. Pt paranoid with ems but cooperative. Pt hesitant to give information.

## 2021-09-05 NOTE — ED Notes (Signed)
Pt refusing blood pressure.

## 2021-09-05 NOTE — Discharge Instructions (Signed)
Eat and drink as well as you can.  Please follow-up with your family doctor in the office.  Please return at any point you would like to be reevaluated and are willing to have further testing done.

## 2021-09-05 NOTE — ED Notes (Signed)
Pt refusing ekg.

## 2021-09-05 NOTE — ED Notes (Signed)
Pt refused covid test

## 2021-09-05 NOTE — ED Notes (Signed)
Pt walking around the department on the phone

## 2021-09-05 NOTE — ED Provider Notes (Signed)
[facility]-EMERGENCY DEPT Provider Note   CSN: [id number] Arrival date & time: 09/05/21  1141     History  Chief Complaint  Patient presents with   Drug Overdose    Allison Woodard is a 31 y.o. female.  31 yo F with a cc of not feeling well.  She has trouble describing this.  Tells me has been going on for about 24 hours.  She is focused more on her pulse oximeter than the conversation with me.  She denies any abdominal pain.  Tells me that she is had a little bit less to eat and drink in 24 hours.  She drank something but is unwilling to tell me what it was.   Drug Overdose      Home Medications Prior to Admission medications   Medication Sig Start Date End Date Taking? Authorizing Provider  acetaminophen (TYLENOL) 325 MG tablet Take 650 mg by mouth every 6 (six) hours as needed for headache.    [provider]  gabapentin (NEURONTIN) 300 MG capsule Take 1 capsule (300 mg total) by mouth 3 (three) times daily. 09/11/18   Malvin Johns, MD  hydrOXYzine (ATARAX/VISTARIL) 10 MG tablet Take 1 tablet (10 mg total) by mouth 3 (three) times daily as needed for anxiety. Patient not taking: Reported on 01/11/2021 09/22/19   Zena Amos, MD  QUEtiapine (SEROQUEL) 50 MG tablet Take 1 tablet (50 mg total) by mouth 3 (three) times daily for 15 days. 09/11/18 09/26/18  Malvin Johns, MD      Allergies    Other    Review of Systems   Review of Systems  Physical Exam Updated Vital Signs Pulse (!) 124   Temp 98.4 F (36.9 C) (Oral)   Resp 18   SpO2 99%  Physical Exam Vitals and nursing note reviewed.  Constitutional:      General: She is not in acute distress.    Appearance: She is well-developed. She is not diaphoretic.  HENT:     Head: Normocephalic and atraumatic.  Eyes:     Pupils: Pupils are equal, round, and reactive to light.  Cardiovascular:     Rate and Rhythm: Regular rhythm. Tachycardia present.     Heart sounds: No murmur heard.   No  friction rub. No gallop.  Pulmonary:     Effort: Pulmonary effort is normal.     Breath sounds: No wheezing or rales.  Abdominal:     General: There is no distension.     Palpations: Abdomen is soft.     Tenderness: There is no abdominal tenderness.  Musculoskeletal:        General: No tenderness.     Cervical back: Normal range of motion and neck supple.  Skin:    General: Skin is warm and dry.  Neurological:     Mental Status: She is alert and oriented to person, place, and time.  Psychiatric:        Behavior: Behavior normal.    ED Results / Procedures / Treatments   Labs (all labs ordered are listed, but only abnormal results are displayed) Labs Reviewed  COMPREHENSIVE METABOLIC PANEL - Abnormal; Notable for the following components:      Result Value   Glucose, Bld 121 (*)    Total Protein 10.0 (*)    All other components within normal limits  RAPID URINE DRUG SCREEN, HOSP PERFORMED - Abnormal; Notable for the following components:   Opiates POSITIVE (*)    Amphetamines POSITIVE (*)  All other components within normal limits  CBC WITH DIFFERENTIAL/PLATELET - Abnormal; Notable for the following components:   WBC 12.5 (*)    RBC 5.21 (*)    Platelets 483 (*)    Neutro Abs 8.4 (*)    All other components within normal limits  SALICYLATE LEVEL - Abnormal; Notable for the following components:   Salicylate Lvl <7.0 (*)    All other components within normal limits  ACETAMINOPHEN LEVEL - Abnormal; Notable for the following components:   Acetaminophen (Tylenol), Serum <10 (*)    All other components within normal limits  RESP PANEL BY RT-PCR (FLU A&B, COVID) ARPGX2  ETHANOL  I-STAT BETA HCG BLOOD, ED ([facility], [facility], [facility] ONLY)    EKG None  Radiology No results found.  Procedures Procedures    Medications Ordered in ED Medications - No data to display  ED Course/ Medical Decision Making/ A&P                           Medical Decision Making Amount and/or  Complexity of Data Reviewed Labs: ordered.   31 yo F with a chief complaints of not feeling well.  She is unable to describe this any further.  Is little bit tachycardic on arrival.  We will obtain a laboratory evaluation and observe in the ED.  She did tell EMS that she took something that made her feel this way but is unwilling to tell me what it was.  Laboratory evaluation without significant finding.  Patient not pregnant intoxicated and salicylate levels are negative.  No significant electrolyte abnormality.  Patient is unwilling to have an EKG performed also will not let me do a blood pressure.  At this point I will sign her out AGAINST MEDICAL ADVICE.  Have her follow-up with her doctor in the office.  1:29 PM:  I have discussed the diagnosis/risks/treatment options with the patient.  Evaluation and diagnostic testing in the emergency department does not suggest an emergent condition requiring admission or immediate intervention beyond what has been performed at this time.  They will follow up with  PCP. We also discussed returning to the ED immediately if new or worsening sx occur. We discussed the sx which are most concerning (e.g., sudden worsening pain, fever, inability to tolerate by mouth) that necessitate immediate return. Medications administered to the patient during their visit and any new prescriptions provided to the patient are listed below.  Medications given during this visit Medications - No data to display   The patient appears reasonably screen and/or stabilized for discharge and I doubt any other medical condition or other [facility] requiring further screening, evaluation, or treatment in the ED at this time prior to discharge.          Final Clinical Impression(s) / ED Diagnoses Final diagnoses:  Sinus tachycardia  Does not feel right    Rx / DC Orders ED Discharge Orders     None         Melene Plan, DO 09/05/21 1329

## 2022-10-19 IMAGING — DX DG ANKLE COMPLETE 3+V*R*
3 series · 3 of 3 positions shown · non-contrast
Comparison: None.

CLINICAL DATA: RIGHT ankle injury, swelling, pain.

EXAM:
RIGHT ANKLE - COMPLETE 3+ VIEW

[ankle ap]
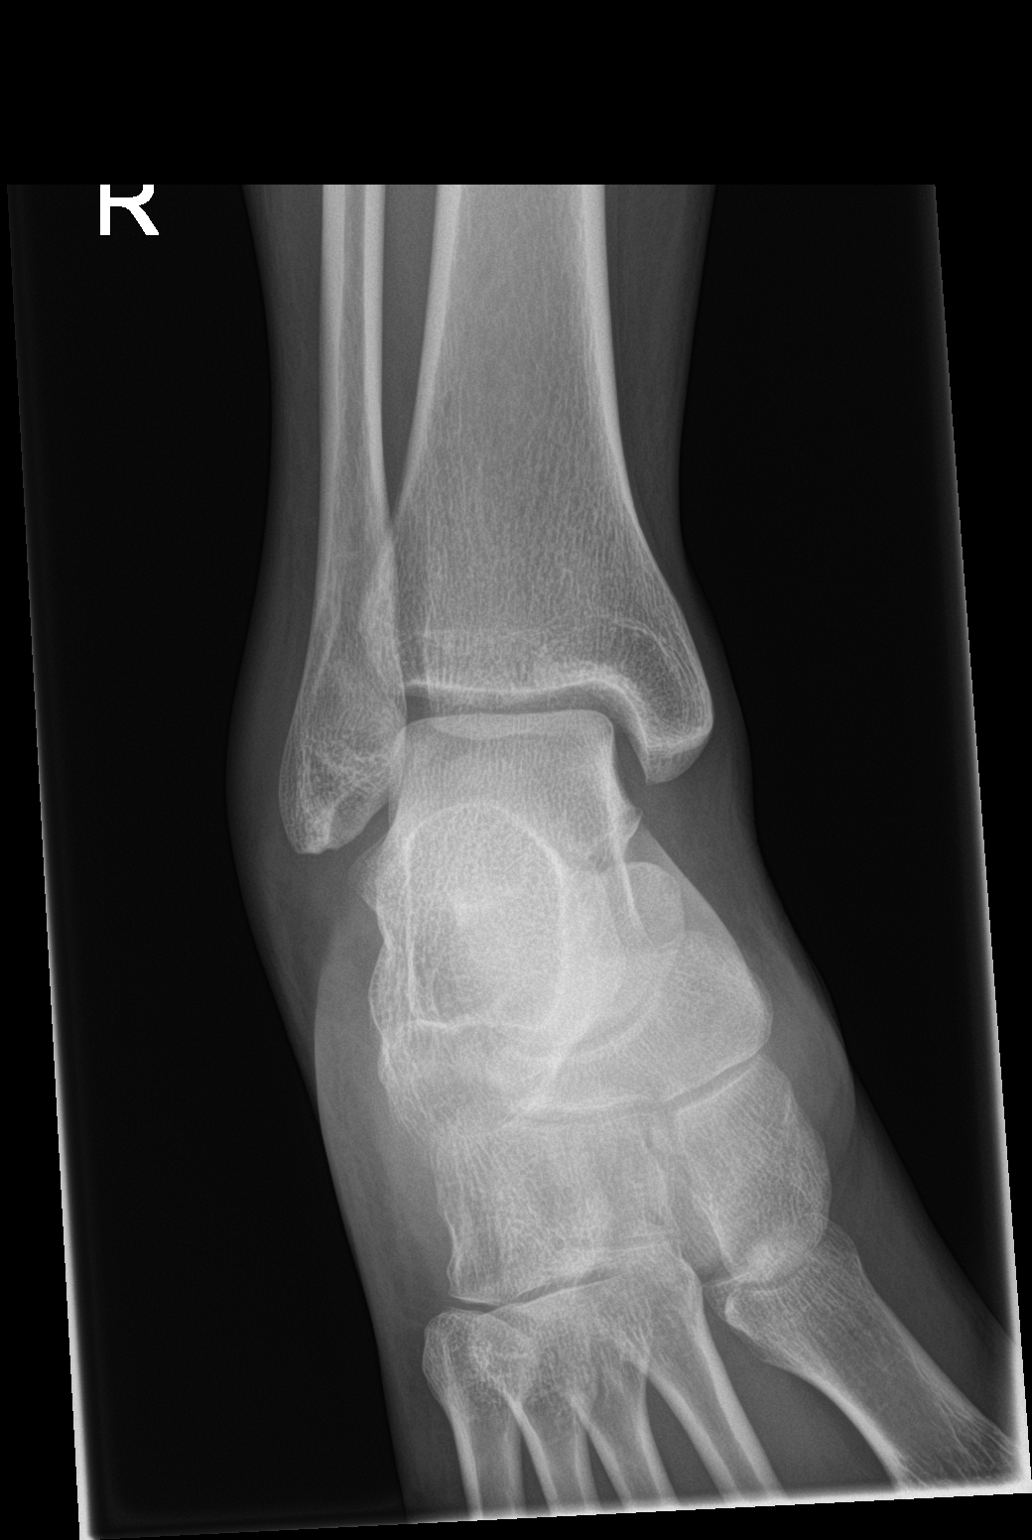

[ankle obl]
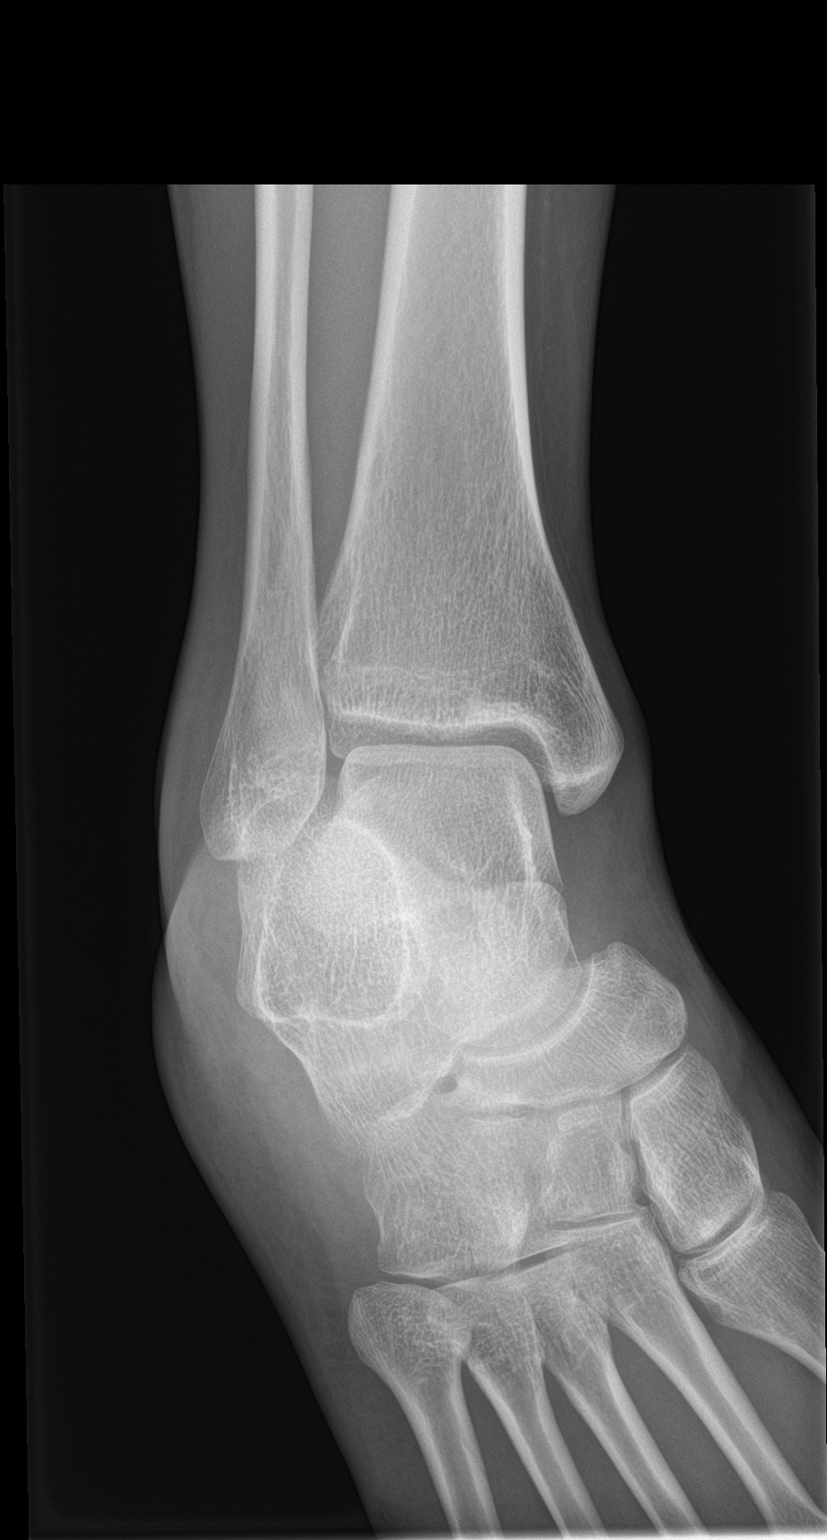

[ankle lat]
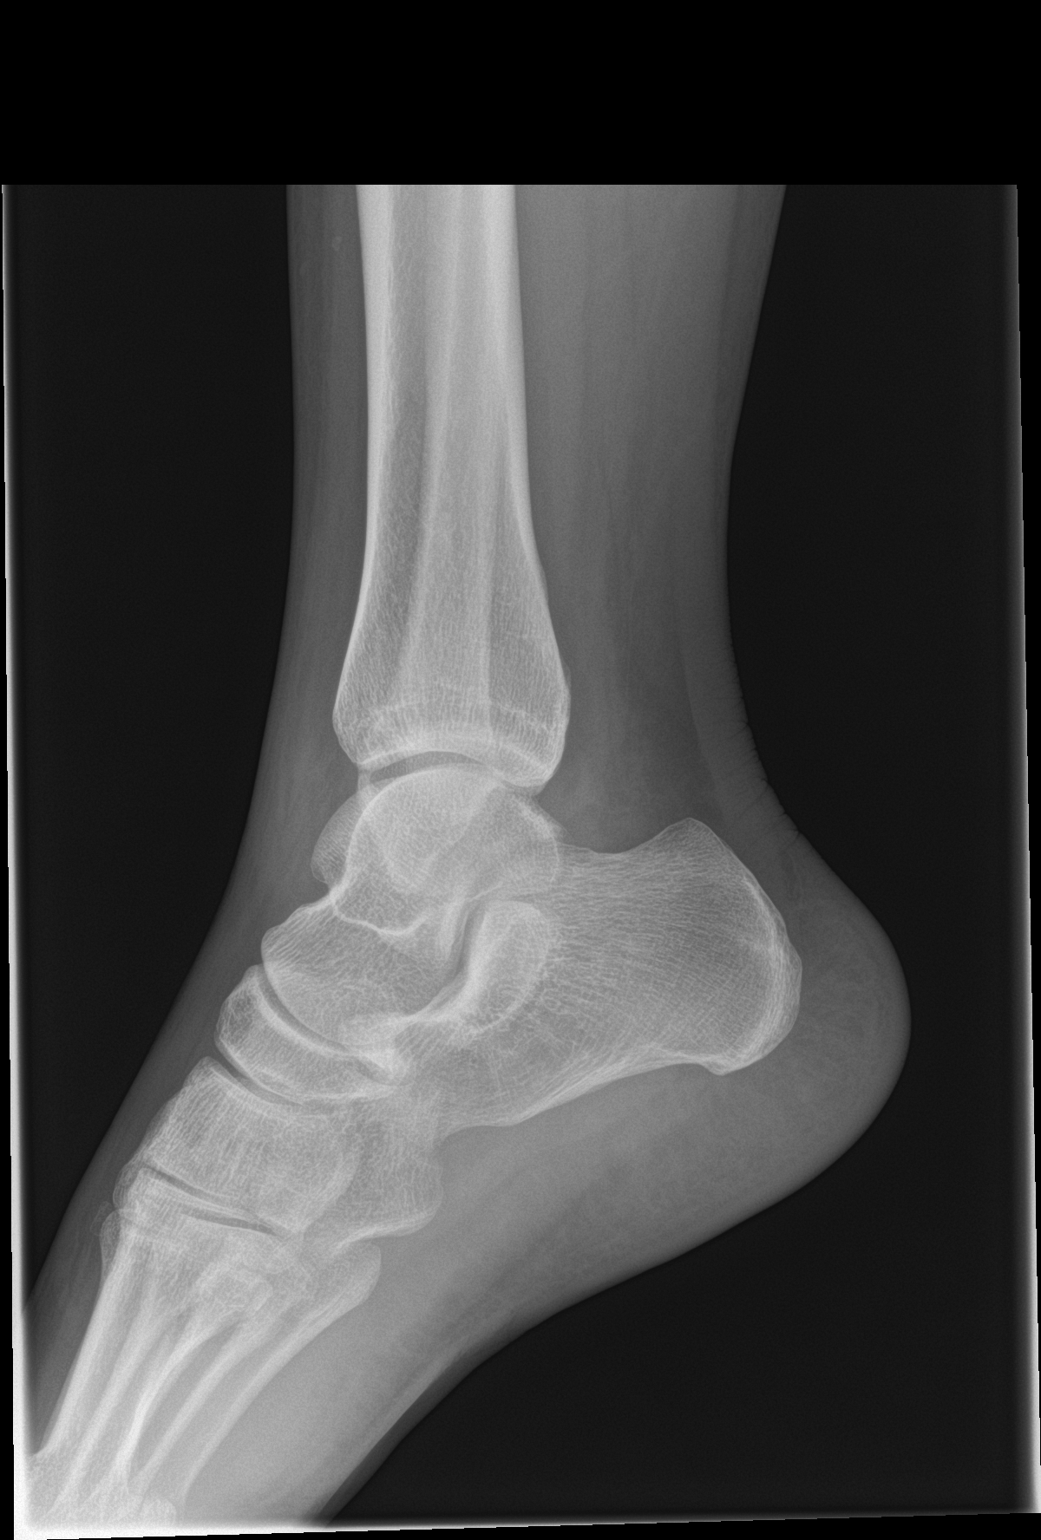

[3 of 3 positions shown; findings below may reference images not displayed]

FINDINGS: Osseous alignment is normal. Ankle mortise is symmetric. No fracture
line or displaced fracture fragment. Visualized portions of the
hindfoot and midfoot appear intact and normally aligned. Soft tissue
swelling over the lateral malleolus.
IMPRESSION: Soft tissue swelling. No osseous fracture or dislocation.

## 2023-01-20 ENCOUNTER — Inpatient Hospital Stay (HOSPITAL_COMMUNITY): Payer: Medicaid Other

## 2023-01-20 ENCOUNTER — Emergency Department (HOSPITAL_COMMUNITY): Payer: Medicaid Other

## 2023-01-20 ENCOUNTER — Inpatient Hospital Stay (HOSPITAL_COMMUNITY)
Admission: EM | Admit: 2023-01-20 | Discharge: 2023-01-27 | DRG: 085 | Disposition: A | Discharge status: Home / Self Care | Payer: Medicaid Other | Attending: Surgery | Admitting: Surgery

## 2023-01-20 DIAGNOSIS — R4182 Altered mental status, unspecified: Secondary | ICD-10-CM | POA: Diagnosis present

## 2023-01-20 DIAGNOSIS — Z681 Body mass index (BMI) 19 or less, adult: Secondary | ICD-10-CM

## 2023-01-20 DIAGNOSIS — R402142 Coma scale, eyes open, spontaneous, at arrival to emergency department: Secondary | ICD-10-CM | POA: Diagnosis present

## 2023-01-20 DIAGNOSIS — S0101XA Laceration without foreign body of scalp, initial encounter: Secondary | ICD-10-CM | POA: Diagnosis present

## 2023-01-20 DIAGNOSIS — Z781 Physical restraint status: Secondary | ICD-10-CM

## 2023-01-20 DIAGNOSIS — S0300XA Dislocation of jaw, unspecified side, initial encounter: Secondary | ICD-10-CM

## 2023-01-20 DIAGNOSIS — S065X0A Traumatic subdural hemorrhage without loss of consciousness, initial encounter: Secondary | ICD-10-CM | POA: Diagnosis present

## 2023-01-20 DIAGNOSIS — Z79899 Other long term (current) drug therapy: Secondary | ICD-10-CM | POA: Diagnosis not present

## 2023-01-20 DIAGNOSIS — E440 Moderate protein-calorie malnutrition: Secondary | ICD-10-CM | POA: Diagnosis present

## 2023-01-20 DIAGNOSIS — Z23 Encounter for immunization: Secondary | ICD-10-CM | POA: Diagnosis not present

## 2023-01-20 DIAGNOSIS — S065XAA Traumatic subdural hemorrhage with loss of consciousness status unknown, initial encounter: Principal | ICD-10-CM

## 2023-01-20 DIAGNOSIS — F17290 Nicotine dependence, other tobacco product, uncomplicated: Secondary | ICD-10-CM | POA: Diagnosis present

## 2023-01-20 DIAGNOSIS — R402222 Coma scale, best verbal response, incomprehensible words, at arrival to emergency department: Secondary | ICD-10-CM | POA: Diagnosis present

## 2023-01-20 DIAGNOSIS — E876 Hypokalemia: Secondary | ICD-10-CM | POA: Diagnosis present

## 2023-01-20 DIAGNOSIS — J9601 Acute respiratory failure with hypoxia: Secondary | ICD-10-CM | POA: Diagnosis present

## 2023-01-20 DIAGNOSIS — R402352 Coma scale, best motor response, localizes pain, at arrival to emergency department: Secondary | ICD-10-CM | POA: Diagnosis present

## 2023-01-20 DIAGNOSIS — S0302XA Dislocation of jaw, left side, initial encounter: Secondary | ICD-10-CM | POA: Diagnosis present

## 2023-01-20 LAB — I-STAT CHEM 8, ED
BUN: 23 mg/dL — ABNORMAL HIGH (ref 6–20)
Calcium, Ion: 1.09 mmol/L — ABNORMAL LOW (ref 1.15–1.40)
Chloride: 106 mmol/L (ref 98–111)
Creatinine, Ser: 1 mg/dL (ref 0.44–1.00)
Glucose, Bld: 88 mg/dL (ref 70–99)
HCT: 37 % (ref 36.0–46.0)
Hemoglobin: 12.6 g/dL (ref 12.0–15.0)
Potassium: 3.5 mmol/L (ref 3.5–5.1)
Sodium: 139 mmol/L (ref 135–145)
TCO2: 20 mmol/L — ABNORMAL LOW (ref 22–32)

## 2023-01-20 LAB — COMPREHENSIVE METABOLIC PANEL
ALT: 55 U/L — ABNORMAL HIGH (ref 0–44)
AST: 126 U/L — ABNORMAL HIGH (ref 15–41)
Albumin: 3.4 g/dL — ABNORMAL LOW (ref 3.5–5.0)
Alkaline Phosphatase: 80 U/L (ref 38–126)
Anion gap: 16 — ABNORMAL HIGH (ref 5–15)
BUN: 21 mg/dL — ABNORMAL HIGH (ref 6–20)
CO2: 19 mmol/L — ABNORMAL LOW (ref 22–32)
Calcium: 9 mg/dL (ref 8.9–10.3)
Chloride: 105 mmol/L (ref 98–111)
Creatinine, Ser: 0.97 mg/dL (ref 0.44–1.00)
GFR, Estimated: >60 mL/min (ref >60)
Glucose, Bld: 91 mg/dL (ref 70–99)
Potassium: 3.5 mmol/L (ref 3.5–5.1)
Sodium: 140 mmol/L (ref 135–145)
Total Bilirubin: 0.7 mg/dL (ref 0.3–1.2)
Total Protein: 7.6 g/dL (ref 6.5–8.1)

## 2023-01-20 LAB — RAPID URINE DRUG SCREEN, HOSP PERFORMED
Amphetamines: POSITIVE — AB
Barbiturates: NOT DETECTED
Benzodiazepines: POSITIVE — AB
Cocaine: NOT DETECTED
Opiates: NOT DETECTED
Tetrahydrocannabinol: NOT DETECTED

## 2023-01-20 LAB — I-STAT ARTERIAL BLOOD GAS, ED
Acid-base deficit: 6 mmol/L — ABNORMAL HIGH (ref 0.0–2.0)
Bicarbonate: 18.7 mmol/L — ABNORMAL LOW (ref 20.0–28.0)
Calcium, Ion: 1.14 mmol/L — ABNORMAL LOW (ref 1.15–1.40)
HCT: 30 % — ABNORMAL LOW (ref 36.0–46.0)
Hemoglobin: 10.2 g/dL — ABNORMAL LOW (ref 12.0–15.0)
O2 Saturation: 100 %
Patient temperature: 97.4
Potassium: 3.4 mmol/L — ABNORMAL LOW (ref 3.5–5.1)
Sodium: 136 mmol/L (ref 135–145)
TCO2: 20 mmol/L — ABNORMAL LOW (ref 22–32)
pCO2 arterial: 31.3 mmHg — ABNORMAL LOW (ref 32–48)
pH, Arterial: 7.382 (ref 7.35–7.45)
pO2, Arterial: 224 mmHg — ABNORMAL HIGH (ref 83–108)

## 2023-01-20 LAB — BASIC METABOLIC PANEL
Anion gap: 5 (ref 5–15)
BUN: 15 mg/dL (ref 6–20)
CO2: 20 mmol/L — ABNORMAL LOW (ref 22–32)
Calcium: 8.3 mg/dL — ABNORMAL LOW (ref 8.9–10.3)
Chloride: 111 mmol/L (ref 98–111)
Creatinine, Ser: 0.8 mg/dL (ref 0.44–1.00)
GFR, Estimated: >60 mL/min (ref >60)
Glucose, Bld: 138 mg/dL — ABNORMAL HIGH (ref 70–99)
Potassium: 4.1 mmol/L (ref 3.5–5.1)
Sodium: 136 mmol/L (ref 135–145)

## 2023-01-20 LAB — URINALYSIS, ROUTINE W REFLEX MICROSCOPIC
Bilirubin Urine: NEGATIVE
Glucose, UA: NEGATIVE mg/dL
Hgb urine dipstick: NEGATIVE
Ketones, ur: NEGATIVE mg/dL
Nitrite: NEGATIVE
Protein, ur: 100 mg/dL — AB
Specific Gravity, Urine: >1.046 — ABNORMAL HIGH (ref 1.005–1.030)
pH: 6 (ref 5.0–8.0)

## 2023-01-20 LAB — CBC
HCT: 35.5 % — ABNORMAL LOW (ref 36.0–46.0)
HCT: 30.7 % — ABNORMAL LOW (ref 36.0–46.0)
Hemoglobin: 11.5 g/dL — ABNORMAL LOW (ref 12.0–15.0)
Hemoglobin: 9.9 g/dL — ABNORMAL LOW (ref 12.0–15.0)
MCH: 28 pg (ref 26.0–34.0)
MCH: 27.7 pg (ref 26.0–34.0)
MCHC: 32.4 g/dL (ref 30.0–36.0)
MCHC: 32.2 g/dL (ref 30.0–36.0)
MCV: 86.4 fL (ref 80.0–100.0)
MCV: 86 fL (ref 80.0–100.0)
Platelets: 368 10*3/uL (ref 150–400)
Platelets: 355 10*3/uL (ref 150–400)
RBC: 4.11 MIL/uL (ref 3.87–5.11)
RBC: 3.57 MIL/uL — ABNORMAL LOW (ref 3.87–5.11)
RDW: 13.9 % (ref 11.5–15.5)
RDW: 14.1 % (ref 11.5–15.5)
WBC: 27.4 10*3/uL — ABNORMAL HIGH (ref 4.0–10.5)
WBC: 18.2 10*3/uL — ABNORMAL HIGH (ref 4.0–10.5)
nRBC: 0 % (ref 0.0–0.2)
nRBC: 0 % (ref 0.0–0.2)

## 2023-01-20 LAB — SAMPLE TO BLOOD BANK

## 2023-01-20 LAB — CBG MONITORING, ED: Glucose-Capillary: 86 mg/dL (ref 70–99)

## 2023-01-20 LAB — PHOSPHORUS: Phosphorus: 3.1 mg/dL (ref 2.5–4.6)

## 2023-01-20 LAB — MAGNESIUM: Magnesium: 2 mg/dL (ref 1.7–2.4)

## 2023-01-20 LAB — MRSA NEXT GEN BY PCR, NASAL: MRSA by PCR Next Gen: NOT DETECTED

## 2023-01-20 LAB — ACETAMINOPHEN LEVEL: Acetaminophen (Tylenol), Serum: <10 ug/mL — ABNORMAL LOW (ref 10–30)

## 2023-01-20 LAB — SALICYLATE LEVEL: Salicylate Lvl: <7 mg/dL — ABNORMAL LOW (ref 7.0–30.0)

## 2023-01-20 LAB — GLUCOSE, CAPILLARY
Glucose-Capillary: 110 mg/dL — ABNORMAL HIGH (ref 70–99)
Glucose-Capillary: 122 mg/dL — ABNORMAL HIGH (ref 70–99)
Glucose-Capillary: 138 mg/dL — ABNORMAL HIGH (ref 70–99)

## 2023-01-20 LAB — HCG, SERUM, QUALITATIVE: Preg, Serum: NEGATIVE

## 2023-01-20 LAB — ETHANOL: Alcohol, Ethyl (B): <10 mg/dL (ref <10)

## 2023-01-20 LAB — PROTIME-INR
INR: 1.1 (ref 0.8–1.2)
Prothrombin Time: 14.1 s (ref 11.4–15.2)

## 2023-01-20 LAB — HIV ANTIBODY (ROUTINE TESTING W REFLEX): HIV Screen 4th Generation wRfx: NONREACTIVE

## 2023-01-20 MED ORDER — METHOCARBAMOL 500 MG PO TABS
500.0000 mg | ORAL_TABLET | Freq: Three times a day (TID) | ORAL | Status: DC
  Filled 2023-01-20: qty 1

## 2023-01-20 MED ORDER — LEVETIRACETAM IN NACL 500 MG/100ML IV SOLN: 500.0000 mg | Freq: Two times a day (BID) | INTRAVENOUS | Status: DC

## 2023-01-20 MED ORDER — DOCUSATE SODIUM 100 MG PO CAPS: 100.0000 mg | ORAL_CAPSULE | Freq: Two times a day (BID) | ORAL | Status: DC

## 2023-01-20 MED ORDER — NOREPINEPHRINE 4 MG/250ML-% IV SOLN
INTRAVENOUS | Status: AC
  Administered 2023-01-20: 4 mg via INTRAVENOUS
  Filled 2023-01-20: qty 250

## 2023-01-20 MED ORDER — FENTANYL BOLUS VIA INFUSION
50.0000 ug | INTRAVENOUS | Status: DC | PRN
  Administered 2023-01-21: 100 ug via INTRAVENOUS
  Administered 2023-01-21 (×2): 50 ug via INTRAVENOUS
  Administered 2023-01-22 (×4): 100 ug via INTRAVENOUS

## 2023-01-20 MED ORDER — THIAMINE MONONITRATE 100 MG PO TABS
100.0000 mg | ORAL_TABLET | Freq: Every day | ORAL | Status: DC
  Administered 2023-01-20 – 2023-01-23 (×4): 100 mg
  Filled 2023-01-20 (×4): qty 1

## 2023-01-20 MED ORDER — SUCCINYLCHOLINE CHLORIDE 200 MG/10ML IV SOSY
100.0000 mg | PREFILLED_SYRINGE | Freq: Once | INTRAVENOUS | Status: AC
  Administered 2023-01-20: 100 mg via INTRAVENOUS

## 2023-01-20 MED ORDER — MIDAZOLAM HCL 2 MG/2ML IJ SOLN
INTRAMUSCULAR | Status: AC
  Filled 2023-01-20: qty 2

## 2023-01-20 MED ORDER — CHLORHEXIDINE GLUCONATE CLOTH 2 % EX PADS
6.0000 | MEDICATED_PAD | Freq: Every day | CUTANEOUS | Status: DC
  Administered 2023-01-20 – 2023-01-23 (×6): 6 via TOPICAL

## 2023-01-20 MED ORDER — LEVETIRACETAM IN NACL 500 MG/100ML IV SOLN
500.0000 mg | Freq: Two times a day (BID) | INTRAVENOUS | Status: DC
  Administered 2023-01-21 – 2023-01-23 (×6): 500 mg via INTRAVENOUS
  Filled 2023-01-20 (×6): qty 100

## 2023-01-20 MED ORDER — FENTANYL CITRATE PF 50 MCG/ML IJ SOSY: 50.0000 ug | PREFILLED_SYRINGE | INTRAMUSCULAR | Status: DC | PRN

## 2023-01-20 MED ORDER — ETOMIDATE 2 MG/ML IV SOLN
INTRAVENOUS | Status: AC | PRN
Start: 2023-01-20 — End: 2023-01-20
  Administered 2023-01-20: 20 mg via INTRAVENOUS

## 2023-01-20 MED ORDER — ACETAMINOPHEN 500 MG PO TABS: 1000.0000 mg | ORAL_TABLET | Freq: Four times a day (QID) | ORAL | Status: DC

## 2023-01-20 MED ORDER — KETAMINE HCL 10 MG/ML IJ SOLN
0.5000 mg/kg/h | Status: DC
  Administered 2023-01-20: 0.5 mg/kg/h via INTRAVENOUS
  Filled 2023-01-20: qty 100

## 2023-01-20 MED ORDER — METHOCARBAMOL 500 MG PO TABS
500.0000 mg | ORAL_TABLET | Freq: Three times a day (TID) | ORAL | Status: AC
  Administered 2023-01-20 – 2023-01-22 (×8): 500 mg
  Filled 2023-01-20 (×8): qty 1

## 2023-01-20 MED ORDER — DEXMEDETOMIDINE HCL IN NACL 400 MCG/100ML IV SOLN
0.0000 ug/kg/h | INTRAVENOUS | Status: DC
  Administered 2023-01-20 (×2): 2 ug/kg/h via INTRAVENOUS
  Administered 2023-01-20: 0.4 ug/kg/h via INTRAVENOUS
  Administered 2023-01-20: 2 ug/kg/h via INTRAVENOUS
  Administered 2023-01-21: 1.5 ug/kg/h via INTRAVENOUS
  Administered 2023-01-21: 1.7 ug/kg/h via INTRAVENOUS
  Administered 2023-01-21: 2 ug/kg/h via INTRAVENOUS
  Administered 2023-01-21: 1.8 ug/kg/h via INTRAVENOUS
  Administered 2023-01-21: 1.9 ug/kg/h via INTRAVENOUS
  Administered 2023-01-21: 1.8 ug/kg/h via INTRAVENOUS
  Administered 2023-01-22: 1.7 ug/kg/h via INTRAVENOUS
  Administered 2023-01-22: 1.8 ug/kg/h via INTRAVENOUS
  Administered 2023-01-22: 1.9 ug/kg/h via INTRAVENOUS
  Administered 2023-01-22: 1.7 ug/kg/h via INTRAVENOUS
  Administered 2023-01-22 – 2023-01-23 (×3): 1.8 ug/kg/h via INTRAVENOUS
  Administered 2023-01-23: 1.6 ug/kg/h via INTRAVENOUS
  Administered 2023-01-23 (×2): 1.8 ug/kg/h via INTRAVENOUS
  Administered 2023-01-23: 1.9 ug/kg/h via INTRAVENOUS
  Filled 2023-01-20: qty 200
  Filled 2023-01-20: qty 300
  Filled 2023-01-20 (×3): qty 100
  Filled 2023-01-20: qty 200
  Filled 2023-01-20 (×5): qty 100
  Filled 2023-01-20: qty 200
  Filled 2023-01-20 (×4): qty 100

## 2023-01-20 MED ORDER — MIDAZOLAM HCL 2 MG/2ML IJ SOLN: 2.0000 mg | Freq: Once | INTRAMUSCULAR | Status: AC

## 2023-01-20 MED ORDER — MIDAZOLAM HCL (PF) 10 MG/2ML IJ SOLN
INTRAMUSCULAR | Status: AC
  Filled 2023-01-20: qty 2

## 2023-01-20 MED ORDER — PROPOFOL 1000 MG/100ML IV EMUL
0.0000 ug/kg/min | INTRAVENOUS | Status: DC
  Administered 2023-01-20: 5 ug/kg/min via INTRAVENOUS
  Administered 2023-01-20: 50 ug/kg/min via INTRAVENOUS
  Administered 2023-01-20: 30 ug/kg/min via INTRAVENOUS
  Administered 2023-01-20 – 2023-01-21 (×2): 50 ug/kg/min via INTRAVENOUS
  Filled 2023-01-20 (×4): qty 100

## 2023-01-20 MED ORDER — METOPROLOL TARTRATE 5 MG/5ML IV SOLN: 5.0000 mg | Freq: Four times a day (QID) | INTRAVENOUS | Status: DC | PRN

## 2023-01-20 MED ORDER — MIDAZOLAM HCL 2 MG/2ML IJ SOLN
4.0000 mg | Freq: Once | INTRAMUSCULAR | Status: AC
  Administered 2023-01-20: 4 mg via INTRAVENOUS

## 2023-01-20 MED ORDER — SUCCINYLCHOLINE CHLORIDE 20 MG/ML IJ SOLN
INTRAMUSCULAR | Status: AC | PRN
Start: 2023-01-20 — End: 2023-01-20
  Administered 2023-01-20: 80 mg via INTRAVENOUS

## 2023-01-20 MED ORDER — OXYCODONE HCL 5 MG/5ML PO SOLN
5.0000 mg | ORAL | Status: DC | PRN
  Administered 2023-01-20: 5 mg
  Administered 2023-01-20 – 2023-01-21 (×4): 10 mg
  Filled 2023-01-20 (×3): qty 10
  Filled 2023-01-20: qty 5
  Filled 2023-01-20: qty 10

## 2023-01-20 MED ORDER — LEVETIRACETAM IN NACL 1000 MG/100ML IV SOLN
1000.0000 mg | Freq: Once | INTRAVENOUS | Status: AC
  Administered 2023-01-20: 1000 mg via INTRAVENOUS

## 2023-01-20 MED ORDER — ONDANSETRON HCL 4 MG/2ML IJ SOLN: 4.0000 mg | Freq: Four times a day (QID) | INTRAMUSCULAR | Status: DC | PRN

## 2023-01-20 MED ORDER — METHOCARBAMOL 1000 MG/10ML IJ SOLN
500.0000 mg | Freq: Three times a day (TID) | INTRAVENOUS | Status: DC
  Filled 2023-01-20: qty 5

## 2023-01-20 MED ORDER — KETAMINE HCL 50 MG/5ML IJ SOSY: 0.5000 mg/kg | PREFILLED_SYRINGE | INTRAMUSCULAR | Status: DC

## 2023-01-20 MED ORDER — ACETAMINOPHEN 500 MG PO TABS
1000.0000 mg | ORAL_TABLET | Freq: Four times a day (QID) | ORAL | Status: DC
  Administered 2023-01-20 – 2023-01-23 (×13): 1000 mg
  Filled 2023-01-20 (×13): qty 2

## 2023-01-20 MED ORDER — MIDAZOLAM HCL 2 MG/2ML IJ SOLN
INTRAMUSCULAR | Status: AC
  Filled 2023-01-20: qty 4

## 2023-01-20 MED ORDER — METHOCARBAMOL 1000 MG/10ML IJ SOLN
500.0000 mg | Freq: Three times a day (TID) | INTRAVENOUS | Status: AC
  Filled 2023-01-20: qty 5

## 2023-01-20 MED ORDER — SUCCINYLCHOLINE CHLORIDE 200 MG/10ML IV SOSY
PREFILLED_SYRINGE | INTRAVENOUS | Status: AC
  Filled 2023-01-20: qty 10

## 2023-01-20 MED ORDER — SODIUM CHLORIDE 0.9% FLUSH
10.0000 mL | Freq: Two times a day (BID) | INTRAVENOUS | Status: DC
  Administered 2023-01-20 – 2023-01-26 (×14): 10 mL via INTRAVENOUS

## 2023-01-20 MED ORDER — NOREPINEPHRINE 4 MG/250ML-% IV SOLN: 0.0000 ug/min | INTRAVENOUS | Status: DC

## 2023-01-20 MED ORDER — ONDANSETRON 4 MG PO TBDP: 4.0000 mg | ORAL_TABLET | Freq: Four times a day (QID) | ORAL | Status: DC | PRN

## 2023-01-20 MED ORDER — SODIUM CHLORIDE 0.9 % IV SOLN: INTRAVENOUS | Status: AC

## 2023-01-20 MED ORDER — TETANUS-DIPHTH-ACELL PERTUSSIS 5-2.5-18.5 LF-MCG/0.5 IM SUSY
0.5000 mL | PREFILLED_SYRINGE | Freq: Once | INTRAMUSCULAR | Status: AC
  Administered 2023-01-20: 0.5 mL via INTRAMUSCULAR

## 2023-01-20 MED ORDER — POLYETHYLENE GLYCOL 3350 17 G PO PACK
17.0000 g | PACK | Freq: Every day | ORAL | Status: DC
  Administered 2023-01-21 – 2023-01-22 (×2): 17 g
  Filled 2023-01-20 (×2): qty 1

## 2023-01-20 MED ORDER — POLYETHYLENE GLYCOL 3350 17 G PO PACK: 17.0000 g | PACK | Freq: Every day | ORAL | Status: DC | PRN

## 2023-01-20 MED ORDER — FENTANYL CITRATE PF 50 MCG/ML IJ SOSY
50.0000 ug | PREFILLED_SYRINGE | Freq: Once | INTRAMUSCULAR | Status: AC
  Administered 2023-01-20: 50 ug via INTRAVENOUS

## 2023-01-20 MED ORDER — ORAL CARE MOUTH RINSE
15.0000 mL | OROMUCOSAL | Status: DC
  Administered 2023-01-20 – 2023-01-23 (×37): 15 mL via OROMUCOSAL

## 2023-01-20 MED ORDER — PIVOT 1.5 CAL PO LIQD
1000.0000 mL | ORAL | Status: DC
  Administered 2023-01-20 – 2023-01-22 (×5): 1000 mL

## 2023-01-20 MED ORDER — DOCUSATE SODIUM 50 MG/5ML PO LIQD
100.0000 mg | Freq: Two times a day (BID) | ORAL | Status: DC
  Administered 2023-01-20 – 2023-01-23 (×6): 100 mg
  Filled 2023-01-20 (×6): qty 10

## 2023-01-20 MED ORDER — IOHEXOL 350 MG/ML SOLN
75.0000 mL | Freq: Once | INTRAVENOUS | Status: AC | PRN
  Administered 2023-01-20: 75 mL via INTRAVENOUS

## 2023-01-20 MED ORDER — HYDRALAZINE HCL 20 MG/ML IJ SOLN: 10.0000 mg | INTRAMUSCULAR | Status: DC | PRN

## 2023-01-20 MED ORDER — FENTANYL 2500MCG IN NS 250ML (10MCG/ML) PREMIX INFUSION
50.0000 ug/h | INTRAVENOUS | Status: DC
  Administered 2023-01-20: 50 ug/h via INTRAVENOUS
  Administered 2023-01-20: 150 ug/h via INTRAVENOUS
  Administered 2023-01-21 (×2): 200 ug/h via INTRAVENOUS
  Administered 2023-01-21 – 2023-01-23 (×4): 150 ug/h via INTRAVENOUS
  Filled 2023-01-20 (×8): qty 250

## 2023-01-20 MED ORDER — NOREPINEPHRINE 4 MG/250ML-% IV SOLN
2.0000 ug/min | INTRAVENOUS | Status: DC
  Administered 2023-01-21: 2 ug/min via INTRAVENOUS
  Administered 2023-01-22 (×2): 5 ug/min via INTRAVENOUS
  Administered 2023-01-23: 7 ug/min via INTRAVENOUS
  Filled 2023-01-20 (×4): qty 250

## 2023-01-20 MED ORDER — ENOXAPARIN SODIUM 30 MG/0.3ML IJ SOSY
30.0000 mg | PREFILLED_SYRINGE | Freq: Two times a day (BID) | INTRAMUSCULAR | Status: DC
  Administered 2023-01-21 – 2023-01-27 (×13): 30 mg via SUBCUTANEOUS
  Filled 2023-01-20 (×13): qty 0.3

## 2023-01-20 MED ORDER — SODIUM CHLORIDE 0.9 % IV SOLN: 250.0000 mL | INTRAVENOUS | Status: DC

## 2023-01-20 MED ORDER — FENTANYL CITRATE PF 50 MCG/ML IJ SOSY
50.0000 ug | PREFILLED_SYRINGE | INTRAMUSCULAR | Status: DC | PRN
  Filled 2023-01-20: qty 1

## 2023-01-20 MED ORDER — MIDAZOLAM HCL 2 MG/2ML IJ SOLN: 2.0000 mg | Freq: Once | INTRAMUSCULAR | Status: DC

## 2023-01-20 MED ORDER — KETAMINE HCL-SODIUM CHLORIDE 1000-0.9 MG/100ML-% IV SOLN
0.5000 mg/kg/h | INTRAVENOUS | Status: DC
  Filled 2023-01-20: qty 100

## 2023-01-20 MED ORDER — ORAL CARE MOUTH RINSE: 15.0000 mL | OROMUCOSAL | Status: DC | PRN
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
## 2023-01-20 NOTE — Progress Notes (Signed)
Anterior head lac found while cleaning pts hair this morning. See media tab. Bleeding controlled, may benefit from closure

## 2023-01-20 NOTE — Progress Notes (Signed)
Patient with high sedation requirements, pulling at restraints and reaching for support devices.  Seems to have a lot of pain related to TMJ dislocation.  Sedation increased, then gave roccuronium.  Applied pressure to mandible - downward on molars, upward on anterior inferior mandible and posterior on the left in attempt to reduce dislocation.  No satisfying click, but appears on exam to be in better alignment.  Will check for reduction on follow up head CT in 6 hours.  Hopefully this will help with sedation requirements.  Quentin Ore, MD General, Bariatric and Minimally Invasive Surgery [facility]

## 2023-01-20 NOTE — Progress Notes (Signed)
Pt transported to CT via vent with no complications noted.
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
## 2023-01-20 NOTE — ED Notes (Signed)
ICU accepting RN at the bedside to transport pt with RT to the inpatient unit.
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
## 2023-01-20 NOTE — TOC CAGE-AID Note (Signed)
Transition of Care [facility]) - CAGE-AID Screening   Patient Details  Name: Allison Woodard MRN: [id number] Date of Birth: 1990-10-03  Transition of Care [facility]) CM/SW Contact:    Judie Bonus, RN Phone Number: 01/20/2023, 3:31 AM   Clinical Narrative:  Pt will need screening at a later date, currently intubated and sedated. Per tx pt does have polysubstance hx with Suboxone prescribed.   CAGE-AID Screening: Substance Abuse Screening unable to be completed due to: : Patient unable to participate (Pt intubated and sedated.)

## 2023-01-21 LAB — CBC
HCT: 30.7 % — ABNORMAL LOW (ref 36.0–46.0)
Hemoglobin: 9.8 g/dL — ABNORMAL LOW (ref 12.0–15.0)
MCH: 28.2 pg (ref 26.0–34.0)
MCHC: 31.9 g/dL (ref 30.0–36.0)
MCV: 88.2 fL (ref 80.0–100.0)
Platelets: 280 10*3/uL (ref 150–400)
RBC: 3.48 MIL/uL — ABNORMAL LOW (ref 3.87–5.11)
RDW: 14.6 % (ref 11.5–15.5)
WBC: 10.1 10*3/uL (ref 4.0–10.5)
nRBC: 0 % (ref 0.0–0.2)

## 2023-01-21 LAB — BASIC METABOLIC PANEL
Anion gap: 7 (ref 5–15)
BUN: 8 mg/dL (ref 6–20)
CO2: 21 mmol/L — ABNORMAL LOW (ref 22–32)
Calcium: 8.1 mg/dL — ABNORMAL LOW (ref 8.9–10.3)
Chloride: 109 mmol/L (ref 98–111)
Creatinine, Ser: 0.58 mg/dL (ref 0.44–1.00)
GFR, Estimated: >60 mL/min (ref >60)
Glucose, Bld: 152 mg/dL — ABNORMAL HIGH (ref 70–99)
Potassium: 3.4 mmol/L — ABNORMAL LOW (ref 3.5–5.1)
Sodium: 137 mmol/L (ref 135–145)

## 2023-01-21 LAB — GLUCOSE, CAPILLARY
Glucose-Capillary: 103 mg/dL — ABNORMAL HIGH (ref 70–99)
Glucose-Capillary: 108 mg/dL — ABNORMAL HIGH (ref 70–99)
Glucose-Capillary: 140 mg/dL — ABNORMAL HIGH (ref 70–99)
Glucose-Capillary: 142 mg/dL — ABNORMAL HIGH (ref 70–99)
Glucose-Capillary: 95 mg/dL (ref 70–99)
Glucose-Capillary: 95 mg/dL (ref 70–99)

## 2023-01-21 LAB — MAGNESIUM
Magnesium: 1.8 mg/dL (ref 1.7–2.4)
Magnesium: 1.8 mg/dL (ref 1.7–2.4)

## 2023-01-21 LAB — TRIGLYCERIDES: Triglycerides: 82 mg/dL (ref <150)

## 2023-01-21 LAB — PHOSPHORUS
Phosphorus: 2.8 mg/dL (ref 2.5–4.6)
Phosphorus: 2.9 mg/dL (ref 2.5–4.6)

## 2023-01-21 MED ORDER — ALBUMIN HUMAN 5 % IV SOLN
12.5000 g | Freq: Once | INTRAVENOUS | Status: AC
  Administered 2023-01-21: 12.5 g via INTRAVENOUS
  Filled 2023-01-21: qty 250

## 2023-01-21 MED ORDER — KETAMINE HCL 10 MG/ML IJ SOLN
1.0000 mg/kg/h | Status: DC
  Administered 2023-01-21 – 2023-01-23 (×3): 1 mg/kg/h via INTRAVENOUS
  Filled 2023-01-21 (×3): qty 100

## 2023-01-21 MED ORDER — POTASSIUM CHLORIDE 20 MEQ PO PACK
40.0000 meq | PACK | Freq: Once | ORAL | Status: AC
  Administered 2023-01-21: 40 meq
  Filled 2023-01-21: qty 2

## 2023-01-21 MED ORDER — CLONAZEPAM 0.5 MG PO TABS
0.5000 mg | ORAL_TABLET | Freq: Two times a day (BID) | ORAL | Status: DC
  Administered 2023-01-21 – 2023-01-23 (×6): 0.5 mg
  Filled 2023-01-21 (×6): qty 1

## 2023-01-21 MED ORDER — SODIUM CHLORIDE 0.9 % IV SOLN
INTRAVENOUS | Status: AC | PRN
  Administered 2023-01-21: 10 mL via INTRAVENOUS

## 2023-01-21 MED ORDER — SODIUM CHLORIDE 0.9 % IV BOLUS
1000.0000 mL | Freq: Once | INTRAVENOUS | Status: AC
  Administered 2023-01-21: 1000 mL via INTRAVENOUS

## 2023-01-21 MED ORDER — QUETIAPINE FUMARATE 25 MG PO TABS
50.0000 mg | ORAL_TABLET | Freq: Two times a day (BID) | ORAL | Status: DC
  Administered 2023-01-21 – 2023-01-22 (×3): 50 mg
  Filled 2023-01-21 (×3): qty 2

## 2023-01-21 MED ORDER — IBUPROFEN 100 MG/5ML PO SUSP
400.0000 mg | Freq: Once | ORAL | Status: AC | PRN
  Administered 2023-01-21: 400 mg
  Filled 2023-01-21: qty 20
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
## 2023-01-22 ENCOUNTER — Inpatient Hospital Stay (HOSPITAL_COMMUNITY): Payer: Medicaid Other

## 2023-01-22 LAB — GLUCOSE, CAPILLARY
Glucose-Capillary: 102 mg/dL — ABNORMAL HIGH (ref 70–99)
Glucose-Capillary: 108 mg/dL — ABNORMAL HIGH (ref 70–99)
Glucose-Capillary: 115 mg/dL — ABNORMAL HIGH (ref 70–99)
Glucose-Capillary: 134 mg/dL — ABNORMAL HIGH (ref 70–99)
Glucose-Capillary: 149 mg/dL — ABNORMAL HIGH (ref 70–99)
Glucose-Capillary: 157 mg/dL — ABNORMAL HIGH (ref 70–99)

## 2023-01-22 LAB — CBC
HCT: 31.4 % — ABNORMAL LOW (ref 36.0–46.0)
Hemoglobin: 10.2 g/dL — ABNORMAL LOW (ref 12.0–15.0)
MCH: 28.9 pg (ref 26.0–34.0)
MCHC: 32.5 g/dL (ref 30.0–36.0)
MCV: 89 fL (ref 80.0–100.0)
Platelets: 296 10*3/uL (ref 150–400)
RBC: 3.53 MIL/uL — ABNORMAL LOW (ref 3.87–5.11)
RDW: 14.9 % (ref 11.5–15.5)
WBC: 17.6 10*3/uL — ABNORMAL HIGH (ref 4.0–10.5)
nRBC: 0 % (ref 0.0–0.2)

## 2023-01-22 LAB — PHOSPHORUS: Phosphorus: 2.5 mg/dL (ref 2.5–4.6)

## 2023-01-22 LAB — BASIC METABOLIC PANEL
Anion gap: 8 (ref 5–15)
BUN: 9 mg/dL (ref 6–20)
CO2: 21 mmol/L — ABNORMAL LOW (ref 22–32)
Calcium: 7.9 mg/dL — ABNORMAL LOW (ref 8.9–10.3)
Chloride: 109 mmol/L (ref 98–111)
Creatinine, Ser: 0.44 mg/dL (ref 0.44–1.00)
GFR, Estimated: >60 mL/min (ref >60)
Glucose, Bld: 124 mg/dL — ABNORMAL HIGH (ref 70–99)
Potassium: 4 mmol/L (ref 3.5–5.1)
Sodium: 138 mmol/L (ref 135–145)

## 2023-01-22 LAB — MAGNESIUM: Magnesium: 1.2 mg/dL — ABNORMAL LOW (ref 1.7–2.4)

## 2023-01-22 MED ORDER — CLONIDINE HCL 0.1 MG PO TABS
0.1000 mg | ORAL_TABLET | Freq: Four times a day (QID) | ORAL | Status: DC
  Administered 2023-01-22 – 2023-01-23 (×5): 0.1 mg
  Filled 2023-01-22 (×5): qty 1

## 2023-01-22 MED ORDER — QUETIAPINE FUMARATE 25 MG PO TABS
50.0000 mg | ORAL_TABLET | Freq: Once | ORAL | Status: AC
  Administered 2023-01-22: 50 mg
  Filled 2023-01-22: qty 2

## 2023-01-22 MED ORDER — POTASSIUM & SODIUM PHOSPHATES 280-160-250 MG PO PACK
1.0000 | PACK | Freq: Three times a day (TID) | ORAL | Status: AC
  Administered 2023-01-22 (×3): 1
  Filled 2023-01-22 (×3): qty 1

## 2023-01-22 MED ORDER — DICYCLOMINE HCL 20 MG PO TABS: 20.0000 mg | ORAL_TABLET | Freq: Four times a day (QID) | ORAL | Status: DC | PRN

## 2023-01-22 MED ORDER — QUETIAPINE FUMARATE 100 MG PO TABS
100.0000 mg | ORAL_TABLET | Freq: Two times a day (BID) | ORAL | Status: DC
  Administered 2023-01-22 – 2023-01-23 (×2): 100 mg
  Filled 2023-01-22 (×2): qty 1

## 2023-01-22 MED ORDER — POLYETHYLENE GLYCOL 3350 17 G PO PACK
17.0000 g | PACK | Freq: Two times a day (BID) | ORAL | Status: DC
  Administered 2023-01-22 – 2023-01-23 (×2): 17 g
  Filled 2023-01-22 (×2): qty 1

## 2023-01-22 MED ORDER — CLONIDINE HCL 0.1 MG PO TABS: 0.1000 mg | ORAL_TABLET | Freq: Every day | ORAL | Status: DC

## 2023-01-22 MED ORDER — CLONIDINE HCL 0.1 MG PO TABS: 0.1000 mg | ORAL_TABLET | ORAL | Status: DC

## 2023-01-22 MED ORDER — NAPROXEN 250 MG PO TABS: 500.0000 mg | ORAL_TABLET | Freq: Two times a day (BID) | ORAL | Status: DC | PRN

## 2023-01-22 MED ORDER — MIDAZOLAM HCL 2 MG/2ML IJ SOLN: 2.0000 mg | Freq: Once | INTRAMUSCULAR | Status: AC

## 2023-01-22 MED ORDER — HYDROXYZINE HCL 25 MG PO TABS: 25.0000 mg | ORAL_TABLET | Freq: Four times a day (QID) | ORAL | Status: DC | PRN

## 2023-01-22 MED ORDER — LOPERAMIDE HCL 1 MG/7.5ML PO SUSP: 2.0000 mg | ORAL | Status: DC | PRN

## 2023-01-22 MED ORDER — MIDAZOLAM HCL 2 MG/2ML IJ SOLN
INTRAMUSCULAR | Status: AC
  Administered 2023-01-22: 2 mg via INTRAVENOUS
  Filled 2023-01-22: qty 2

## 2023-01-22 MED ORDER — MAGNESIUM SULFATE 4 GM/100ML IV SOLN
4.0000 g | Freq: Once | INTRAVENOUS | Status: AC
  Administered 2023-01-22: 4 g via INTRAVENOUS
  Filled 2023-01-22: qty 100
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
## 2023-01-23 DIAGNOSIS — E440 Moderate protein-calorie malnutrition: Secondary | ICD-10-CM | POA: Insufficient documentation

## 2023-01-23 LAB — BASIC METABOLIC PANEL WITH GFR
Anion gap: 8 (ref 5–15)
BUN: 9 mg/dL (ref 6–20)
CO2: 24 mmol/L (ref 22–32)
Calcium: 8 mg/dL — ABNORMAL LOW (ref 8.9–10.3)
Chloride: 104 mmol/L (ref 98–111)
Creatinine, Ser: 0.5 mg/dL (ref 0.44–1.00)
GFR, Estimated: >60 mL/min
Glucose, Bld: 156 mg/dL — ABNORMAL HIGH (ref 70–99)
Potassium: 3.7 mmol/L (ref 3.5–5.1)
Sodium: 136 mmol/L (ref 135–145)

## 2023-01-23 LAB — CBC
HCT: 28.8 % — ABNORMAL LOW (ref 36.0–46.0)
Hemoglobin: 9.3 g/dL — ABNORMAL LOW (ref 12.0–15.0)
MCH: 28.4 pg (ref 26.0–34.0)
MCHC: 32.3 g/dL (ref 30.0–36.0)
MCV: 87.8 fL (ref 80.0–100.0)
Platelets: 274 K/uL (ref 150–400)
RBC: 3.28 MIL/uL — ABNORMAL LOW (ref 3.87–5.11)
RDW: 15 % (ref 11.5–15.5)
WBC: 16.1 K/uL — ABNORMAL HIGH (ref 4.0–10.5)
nRBC: 0 % (ref 0.0–0.2)

## 2023-01-23 LAB — MAGNESIUM: Magnesium: 1.7 mg/dL (ref 1.7–2.4)

## 2023-01-23 LAB — GLUCOSE, CAPILLARY
Glucose-Capillary: 111 mg/dL — ABNORMAL HIGH (ref 70–99)
Glucose-Capillary: 129 mg/dL — ABNORMAL HIGH (ref 70–99)
Glucose-Capillary: 133 mg/dL — ABNORMAL HIGH (ref 70–99)
Glucose-Capillary: 85 mg/dL (ref 70–99)
Glucose-Capillary: 91 mg/dL (ref 70–99)
Glucose-Capillary: 99 mg/dL (ref 70–99)

## 2023-01-23 LAB — URINE CULTURE
Culture: 30000 — AB
Special Requests: NORMAL

## 2023-01-23 LAB — PHOSPHORUS: Phosphorus: 2.1 mg/dL — ABNORMAL LOW (ref 2.5–4.6)

## 2023-01-23 MED ORDER — THIAMINE MONONITRATE 100 MG PO TABS
100.0000 mg | ORAL_TABLET | Freq: Every day | ORAL | Status: AC
  Administered 2023-01-24 – 2023-01-26 (×3): 100 mg via ORAL
  Filled 2023-01-23 (×3): qty 1

## 2023-01-23 MED ORDER — CLONIDINE HCL 0.1 MG PO TABS
0.1000 mg | ORAL_TABLET | ORAL | Status: AC
  Administered 2023-01-24 – 2023-01-25 (×4): 0.1 mg via ORAL
  Filled 2023-01-23 (×4): qty 1

## 2023-01-23 MED ORDER — CLONAZEPAM 0.5 MG PO TABS: 0.5000 mg | ORAL_TABLET | Freq: Two times a day (BID) | ORAL | Status: DC

## 2023-01-23 MED ORDER — CLONIDINE HCL 0.1 MG PO TABS
0.1000 mg | ORAL_TABLET | Freq: Every day | ORAL | Status: AC
  Administered 2023-01-26 – 2023-01-27 (×2): 0.1 mg via ORAL
  Filled 2023-01-23 (×2): qty 1

## 2023-01-23 MED ORDER — ORAL CARE MOUTH RINSE: 15.0000 mL | OROMUCOSAL | Status: DC | PRN

## 2023-01-23 MED ORDER — POLYETHYLENE GLYCOL 3350 17 G PO PACK
17.0000 g | PACK | Freq: Two times a day (BID) | ORAL | Status: DC
  Filled 2023-01-23 (×3): qty 1

## 2023-01-23 MED ORDER — OXYCODONE HCL 5 MG/5ML PO SOLN
5.0000 mg | ORAL | Status: DC | PRN
  Administered 2023-01-23: 10 mg via ORAL
  Filled 2023-01-23: qty 10

## 2023-01-23 MED ORDER — MAGNESIUM SULFATE 2 GM/50ML IV SOLN
2.0000 g | Freq: Once | INTRAVENOUS | Status: AC
  Administered 2023-01-23: 2 g via INTRAVENOUS
  Filled 2023-01-23: qty 50

## 2023-01-23 MED ORDER — ACETAMINOPHEN 500 MG PO TABS
1000.0000 mg | ORAL_TABLET | Freq: Four times a day (QID) | ORAL | Status: DC
  Administered 2023-01-24 – 2023-01-27 (×14): 1000 mg via ORAL
  Filled 2023-01-23 (×14): qty 2

## 2023-01-23 MED ORDER — POTASSIUM & SODIUM PHOSPHATES 280-160-250 MG PO PACK
2.0000 | PACK | ORAL | Status: DC
  Administered 2023-01-23: 2
  Filled 2023-01-23: qty 1

## 2023-01-23 MED ORDER — HYDROXYZINE HCL 25 MG PO TABS
25.0000 mg | ORAL_TABLET | Freq: Four times a day (QID) | ORAL | Status: AC | PRN
  Administered 2023-01-23 – 2023-01-25 (×3): 25 mg via ORAL
  Filled 2023-01-23 (×3): qty 1

## 2023-01-23 MED ORDER — CLONIDINE HCL 0.1 MG PO TABS
0.1000 mg | ORAL_TABLET | Freq: Four times a day (QID) | ORAL | Status: AC
  Administered 2023-01-23: 0.1 mg via ORAL
  Filled 2023-01-23: qty 1

## 2023-01-23 MED ORDER — DOCUSATE SODIUM 50 MG/5ML PO LIQD
100.0000 mg | Freq: Two times a day (BID) | ORAL | Status: DC
  Administered 2023-01-23: 100 mg via ORAL
  Filled 2023-01-23: qty 10

## 2023-01-23 MED ORDER — QUETIAPINE FUMARATE 100 MG PO TABS
100.0000 mg | ORAL_TABLET | Freq: Two times a day (BID) | ORAL | Status: DC
  Administered 2023-01-23: 100 mg via ORAL
  Filled 2023-01-23: qty 1

## 2023-01-23 MED ORDER — DICYCLOMINE HCL 20 MG PO TABS: 20.0000 mg | ORAL_TABLET | Freq: Four times a day (QID) | ORAL | Status: AC | PRN

## 2023-01-23 MED ORDER — NAPROXEN 250 MG PO TABS: 500.0000 mg | ORAL_TABLET | Freq: Two times a day (BID) | ORAL | Status: DC | PRN

## 2023-01-23 MED ORDER — LOPERAMIDE HCL 1 MG/7.5ML PO SUSP: 2.0000 mg | ORAL | Status: AC | PRN

## 2023-01-23 MED ORDER — POTASSIUM & SODIUM PHOSPHATES 280-160-250 MG PO PACK
2.0000 | PACK | ORAL | Status: AC
  Administered 2023-01-23 – 2023-01-24 (×2): 2 via ORAL
  Filled 2023-01-23: qty 1
  Filled 2023-01-23: qty 2
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
## 2023-01-23 NOTE — Progress Notes (Signed)
  Progress Note   Date: 01/22/2023  [name]        MRN#: [id number]  Clarification of diagnosis:  moderate malnutrition

## 2023-01-23 NOTE — Procedures (Signed)
Extubation Procedure Note  Patient Details:   Name: Allison Woodard DOB: 1990/07/06 MRN: [id number]   Airway Documentation:    Vent end date: 01/23/23 Vent end time: 1321   Evaluation  O2 sats: stable throughout Complications: Complications of self extubation Patient did tolerate procedure well. Bilateral Breath Sounds: Diminished, Rhonchi   Yes Pt self extubated at this time and had a period of emesis. Pt auscultated and rhonchi was noted. Pt is doing well on 4L at this time and is VSS.  Jolayne Panther 01/23/2023, 1:21 PM
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
## 2023-01-23 NOTE — Progress Notes (Signed)
  Progress Note   Date: 01/22/2023  [name]        MRN#: [id number]  Clarification of the diagnosis of respiratory failure:       Acute hypoxic respiratory failure requiring ventilator support

## 2023-01-24 LAB — CBC
HCT: 28.1 % — ABNORMAL LOW (ref 36.0–46.0)
Hemoglobin: 9.2 g/dL — ABNORMAL LOW (ref 12.0–15.0)
MCH: 28 pg (ref 26.0–34.0)
MCHC: 32.7 g/dL (ref 30.0–36.0)
MCV: 85.7 fL (ref 80.0–100.0)
Platelets: 355 10*3/uL (ref 150–400)
RBC: 3.28 MIL/uL — ABNORMAL LOW (ref 3.87–5.11)
RDW: 14.8 % (ref 11.5–15.5)
WBC: 10.1 10*3/uL (ref 4.0–10.5)
nRBC: 0 % (ref 0.0–0.2)

## 2023-01-24 LAB — GLUCOSE, CAPILLARY
Glucose-Capillary: 84 mg/dL (ref 70–99)
Glucose-Capillary: 68 mg/dL — ABNORMAL LOW (ref 70–99)
Glucose-Capillary: 83 mg/dL (ref 70–99)
Glucose-Capillary: 75 mg/dL (ref 70–99)
Glucose-Capillary: 91 mg/dL (ref 70–99)
Glucose-Capillary: 145 mg/dL — ABNORMAL HIGH (ref 70–99)

## 2023-01-24 LAB — BASIC METABOLIC PANEL
Anion gap: 15 (ref 5–15)
BUN: 6 mg/dL (ref 6–20)
CO2: 20 mmol/L — ABNORMAL LOW (ref 22–32)
Calcium: 8.6 mg/dL — ABNORMAL LOW (ref 8.9–10.3)
Chloride: 104 mmol/L (ref 98–111)
Creatinine, Ser: 0.48 mg/dL (ref 0.44–1.00)
GFR, Estimated: >60 mL/min (ref >60)
Glucose, Bld: 92 mg/dL (ref 70–99)
Potassium: 3.7 mmol/L (ref 3.5–5.1)
Sodium: 139 mmol/L (ref 135–145)

## 2023-01-24 LAB — CULTURE, RESPIRATORY W GRAM STAIN: Special Requests: NORMAL

## 2023-01-24 LAB — MAGNESIUM: Magnesium: 1.9 mg/dL (ref 1.7–2.4)

## 2023-01-24 LAB — PHOSPHORUS: Phosphorus: 3.3 mg/dL (ref 2.5–4.6)

## 2023-01-24 MED ORDER — BOOST / RESOURCE BREEZE PO LIQD CUSTOM
1.0000 | Freq: Three times a day (TID) | ORAL | Status: DC
  Administered 2023-01-24 – 2023-01-26 (×3): 1 via ORAL

## 2023-01-24 MED ORDER — QUETIAPINE FUMARATE 50 MG PO TABS: 50.0000 mg | ORAL_TABLET | Freq: Two times a day (BID) | ORAL | Status: DC | PRN

## 2023-01-24 MED ORDER — OXYCODONE HCL 5 MG/5ML PO SOLN
5.0000 mg | ORAL | Status: DC | PRN
  Administered 2023-01-24 – 2023-01-27 (×6): 5 mg via ORAL
  Filled 2023-01-24 (×6): qty 5

## 2023-01-24 MED ORDER — LEVETIRACETAM 500 MG PO TABS
500.0000 mg | ORAL_TABLET | Freq: Two times a day (BID) | ORAL | Status: DC
  Administered 2023-01-24 – 2023-01-27 (×7): 500 mg via ORAL
  Filled 2023-01-24 (×7): qty 1

## 2023-01-24 MED ORDER — DOCUSATE SODIUM 100 MG PO CAPS
100.0000 mg | ORAL_CAPSULE | Freq: Two times a day (BID) | ORAL | Status: DC
  Administered 2023-01-24 – 2023-01-27 (×5): 100 mg via ORAL
  Filled 2023-01-24 (×7): qty 1

## 2023-01-24 MED ORDER — QUETIAPINE FUMARATE 100 MG PO TABS
100.0000 mg | ORAL_TABLET | Freq: Every day | ORAL | Status: DC
  Administered 2023-01-24 – 2023-01-26 (×3): 100 mg via ORAL
  Filled 2023-01-24 (×3): qty 1

## 2023-01-24 MED ORDER — MORPHINE SULFATE (PF) 2 MG/ML IV SOLN: 2.0000 mg | Freq: Four times a day (QID) | INTRAVENOUS | Status: DC | PRN
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
## 2023-01-24 NOTE — Plan of Care (Signed)
[facility] Tonsillectomy/Adenoidectomy, Postoperative PEDS care plan entered in error.
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
## 2023-01-25 LAB — BASIC METABOLIC PANEL
Anion gap: 9 (ref 5–15)
BUN: 6 mg/dL (ref 6–20)
CO2: 23 mmol/L (ref 22–32)
Calcium: 8.7 mg/dL — ABNORMAL LOW (ref 8.9–10.3)
Chloride: 106 mmol/L (ref 98–111)
Creatinine, Ser: 0.53 mg/dL (ref 0.44–1.00)
GFR, Estimated: >60 mL/min (ref >60)
Glucose, Bld: 104 mg/dL — ABNORMAL HIGH (ref 70–99)
Potassium: 3.4 mmol/L — ABNORMAL LOW (ref 3.5–5.1)
Sodium: 138 mmol/L (ref 135–145)

## 2023-01-25 LAB — GLUCOSE, CAPILLARY
Glucose-Capillary: 106 mg/dL — ABNORMAL HIGH (ref 70–99)
Glucose-Capillary: 109 mg/dL — ABNORMAL HIGH (ref 70–99)
Glucose-Capillary: 149 mg/dL — ABNORMAL HIGH (ref 70–99)
Glucose-Capillary: 76 mg/dL (ref 70–99)
Glucose-Capillary: 92 mg/dL (ref 70–99)
Glucose-Capillary: 94 mg/dL (ref 70–99)

## 2023-01-25 LAB — CBC
HCT: 27.9 % — ABNORMAL LOW (ref 36.0–46.0)
Hemoglobin: 9.3 g/dL — ABNORMAL LOW (ref 12.0–15.0)
MCH: 28.2 pg (ref 26.0–34.0)
MCHC: 33.3 g/dL (ref 30.0–36.0)
MCV: 84.5 fL (ref 80.0–100.0)
Platelets: 405 10*3/uL — ABNORMAL HIGH (ref 150–400)
RBC: 3.3 MIL/uL — ABNORMAL LOW (ref 3.87–5.11)
RDW: 14.6 % (ref 11.5–15.5)
WBC: 9.7 10*3/uL (ref 4.0–10.5)
nRBC: 0 % (ref 0.0–0.2)

## 2023-01-25 MED ORDER — POLYETHYLENE GLYCOL 3350 17 G PO PACK: 17.0000 g | PACK | Freq: Every day | ORAL | Status: DC | PRN
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
## 2023-01-25 NOTE — Plan of Care (Signed)
  Problem: Pain Managment: Goal: General experience of comfort will improve Outcome: Progressing   Problem: Safety: Goal: Ability to remain free from injury will improve Outcome: Progressing   Problem: Nutrition: Goal: Adequate nutrition will be maintained Outcome: Progressing   

## 2023-01-26 LAB — BASIC METABOLIC PANEL
Anion gap: 10 (ref 5–15)
BUN: 6 mg/dL (ref 6–20)
CO2: 23 mmol/L (ref 22–32)
Calcium: 8.8 mg/dL — ABNORMAL LOW (ref 8.9–10.3)
Chloride: 105 mmol/L (ref 98–111)
Creatinine, Ser: 0.63 mg/dL (ref 0.44–1.00)
GFR, Estimated: >60 mL/min (ref >60)
Glucose, Bld: 103 mg/dL — ABNORMAL HIGH (ref 70–99)
Potassium: 3.8 mmol/L (ref 3.5–5.1)
Sodium: 138 mmol/L (ref 135–145)

## 2023-01-26 LAB — CBC
HCT: 31.7 % — ABNORMAL LOW (ref 36.0–46.0)
Hemoglobin: 10.4 g/dL — ABNORMAL LOW (ref 12.0–15.0)
MCH: 28 pg (ref 26.0–34.0)
MCHC: 32.8 g/dL (ref 30.0–36.0)
MCV: 85.2 fL (ref 80.0–100.0)
Platelets: 492 10*3/uL — ABNORMAL HIGH (ref 150–400)
RBC: 3.72 MIL/uL — ABNORMAL LOW (ref 3.87–5.11)
RDW: 14.4 % (ref 11.5–15.5)
WBC: 10.1 10*3/uL (ref 4.0–10.5)
nRBC: 0 % (ref 0.0–0.2)

## 2023-01-26 LAB — GLUCOSE, CAPILLARY
Glucose-Capillary: 123 mg/dL — ABNORMAL HIGH (ref 70–99)
Glucose-Capillary: 92 mg/dL (ref 70–99)
Glucose-Capillary: 89 mg/dL (ref 70–99)
Glucose-Capillary: 92 mg/dL (ref 70–99)
Glucose-Capillary: 97 mg/dL (ref 70–99)
Glucose-Capillary: 119 mg/dL — ABNORMAL HIGH (ref 70–99)

## 2023-01-26 MED ORDER — POTASSIUM CHLORIDE 20 MEQ PO PACK
40.0000 meq | PACK | Freq: Two times a day (BID) | ORAL | Status: DC
  Administered 2023-01-26 – 2023-01-27 (×3): 40 meq via ORAL
  Filled 2023-01-26 (×3): qty 2
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
## 2023-01-26 NOTE — Plan of Care (Signed)
  Problem: Pain Managment: Goal: General experience of comfort will improve Outcome: Progressing   Problem: Activity: Goal: Risk for activity intolerance will decrease Outcome: Progressing   Problem: Nutrition: Goal: Adequate nutrition will be maintained Outcome: Progressing   Problem: Coping: Goal: Level of anxiety will decrease Outcome: Progressing

## 2023-01-27 ENCOUNTER — Other Ambulatory Visit (HOSPITAL_COMMUNITY): Payer: Self-pay

## 2023-01-27 LAB — CULTURE, BLOOD (ROUTINE X 2)
Culture: NO GROWTH
Culture: NO GROWTH
Special Requests: ADEQUATE
Special Requests: ADEQUATE

## 2023-01-27 LAB — CBC
HCT: 32.9 % — ABNORMAL LOW (ref 36.0–46.0)
Hemoglobin: 10.5 g/dL — ABNORMAL LOW (ref 12.0–15.0)
MCH: 27.6 pg (ref 26.0–34.0)
MCHC: 31.9 g/dL (ref 30.0–36.0)
MCV: 86.6 fL (ref 80.0–100.0)
Platelets: 501 10*3/uL — ABNORMAL HIGH (ref 150–400)
RBC: 3.8 MIL/uL — ABNORMAL LOW (ref 3.87–5.11)
RDW: 14.6 % (ref 11.5–15.5)
WBC: 9.9 10*3/uL (ref 4.0–10.5)
nRBC: 0 % (ref 0.0–0.2)

## 2023-01-27 LAB — BASIC METABOLIC PANEL
Anion gap: 9 (ref 5–15)
BUN: 7 mg/dL (ref 6–20)
CO2: 23 mmol/L (ref 22–32)
Calcium: 9 mg/dL (ref 8.9–10.3)
Chloride: 105 mmol/L (ref 98–111)
Creatinine, Ser: 0.51 mg/dL (ref 0.44–1.00)
GFR, Estimated: >60 mL/min (ref >60)
Glucose, Bld: 115 mg/dL — ABNORMAL HIGH (ref 70–99)
Potassium: 3.9 mmol/L (ref 3.5–5.1)
Sodium: 137 mmol/L (ref 135–145)

## 2023-01-27 LAB — GLUCOSE, CAPILLARY
Glucose-Capillary: 113 mg/dL — ABNORMAL HIGH (ref 70–99)
Glucose-Capillary: 126 mg/dL — ABNORMAL HIGH (ref 70–99)
Glucose-Capillary: 119 mg/dL — ABNORMAL HIGH (ref 70–99)

## 2023-01-27 MED ORDER — OXYCODONE HCL 5 MG PO TABS
5.0000 mg | ORAL_TABLET | Freq: Four times a day (QID) | ORAL | 0 refills | Status: DC | PRN
  Filled 2023-01-27: qty 10, 3d supply, fill #0

## 2023-01-27 MED ORDER — ACETAMINOPHEN 500 MG PO TABS: 1000.0000 mg | ORAL_TABLET | Freq: Four times a day (QID) | ORAL | Status: DC | PRN

## 2023-01-27 MED ORDER — LEVETIRACETAM 500 MG PO TABS
500.0000 mg | ORAL_TABLET | Freq: Two times a day (BID) | ORAL | 0 refills | Status: DC
  Filled 2023-01-27: qty 3, 2d supply, fill #0
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
## 2023-01-27 NOTE — Progress Notes (Signed)
Patient discharged to home, TOC meds delivered, AVS reviewed with the patient and her uncle. IV removed, one staple removed per orders.

## 2023-01-27 NOTE — Plan of Care (Signed)
  Problem: Safety: Goal: Non-violent Restraint(s) Outcome: Progressing   Problem: Clinical Measurements: Goal: Ability to maintain clinical measurements within normal limits will improve Outcome: Progressing   Problem: Pain Managment: Goal: General experience of comfort will improve Outcome: Progressing   Problem: Safety: Goal: Ability to remain free from injury will improve Outcome: Progressing

## 2023-02-03 ENCOUNTER — Other Ambulatory Visit (HOSPITAL_COMMUNITY)
Admission: EM | Admit: 2023-02-03 | Discharge: 2023-02-03 | Disposition: A | Discharge status: Other Acute Inpatient Hospital | Payer: Medicaid Other | Attending: Psychiatry | Admitting: Psychiatry

## 2023-02-03 ENCOUNTER — Other Ambulatory Visit (HOSPITAL_COMMUNITY)
Admission: EM | Admit: 2023-02-03 | Discharge: 2023-02-04 | Disposition: A | Discharge status: Other Acute Inpatient Hospital | Payer: Medicaid Other | Source: Home / Self Care | Admitting: Psychiatry

## 2023-02-03 ENCOUNTER — Other Ambulatory Visit: Payer: Self-pay

## 2023-02-03 DIAGNOSIS — F1310 Sedative, hypnotic or anxiolytic abuse, uncomplicated: Secondary | ICD-10-CM | POA: Insufficient documentation

## 2023-02-03 DIAGNOSIS — Z5901 Sheltered homelessness: Secondary | ICD-10-CM | POA: Insufficient documentation

## 2023-02-03 DIAGNOSIS — Z813 Family history of other psychoactive substance abuse and dependence: Secondary | ICD-10-CM | POA: Insufficient documentation

## 2023-02-03 DIAGNOSIS — S065XAD Traumatic subdural hemorrhage with loss of consciousness status unknown, subsequent encounter: Secondary | ICD-10-CM | POA: Insufficient documentation

## 2023-02-03 DIAGNOSIS — F1910 Other psychoactive substance abuse, uncomplicated: Secondary | ICD-10-CM | POA: Diagnosis present

## 2023-02-03 DIAGNOSIS — F17210 Nicotine dependence, cigarettes, uncomplicated: Secondary | ICD-10-CM | POA: Insufficient documentation

## 2023-02-03 DIAGNOSIS — R519 Headache, unspecified: Secondary | ICD-10-CM | POA: Insufficient documentation

## 2023-02-03 DIAGNOSIS — F1120 Opioid dependence, uncomplicated: Secondary | ICD-10-CM | POA: Insufficient documentation

## 2023-02-03 DIAGNOSIS — Z91414 Personal history of adult intimate partner abuse: Secondary | ICD-10-CM | POA: Insufficient documentation

## 2023-02-03 DIAGNOSIS — Z3202 Encounter for pregnancy test, result negative: Secondary | ICD-10-CM | POA: Diagnosis not present

## 2023-02-03 DIAGNOSIS — F329 Major depressive disorder, single episode, unspecified: Secondary | ICD-10-CM | POA: Insufficient documentation

## 2023-02-03 DIAGNOSIS — F1510 Other stimulant abuse, uncomplicated: Secondary | ICD-10-CM | POA: Insufficient documentation

## 2023-02-03 DIAGNOSIS — Z6372 Alcoholism and drug addiction in family: Secondary | ICD-10-CM | POA: Insufficient documentation

## 2023-02-03 DIAGNOSIS — F1290 Cannabis use, unspecified, uncomplicated: Secondary | ICD-10-CM | POA: Insufficient documentation

## 2023-02-03 DIAGNOSIS — F17200 Nicotine dependence, unspecified, uncomplicated: Secondary | ICD-10-CM

## 2023-02-03 DIAGNOSIS — D75838 Other thrombocytosis: Secondary | ICD-10-CM | POA: Insufficient documentation

## 2023-02-03 DIAGNOSIS — F1590 Other stimulant use, unspecified, uncomplicated: Secondary | ICD-10-CM

## 2023-02-03 LAB — CBC WITH DIFFERENTIAL/PLATELET
Abs Immature Granulocytes: 0.05 10*3/uL (ref 0.00–0.07)
Basophils Absolute: 0.1 10*3/uL (ref 0.0–0.1)
Basophils Relative: 1 %
Eosinophils Absolute: 0 10*3/uL (ref 0.0–0.5)
Eosinophils Relative: 0 %
HCT: 31.3 % — ABNORMAL LOW (ref 36.0–46.0)
Hemoglobin: 10 g/dL — ABNORMAL LOW (ref 12.0–15.0)
Immature Granulocytes: 0 %
Lymphocytes Relative: 17 %
Lymphs Abs: 2.3 10*3/uL (ref 0.7–4.0)
MCH: 27.9 pg (ref 26.0–34.0)
MCHC: 31.9 g/dL (ref 30.0–36.0)
MCV: 87.4 fL (ref 80.0–100.0)
Monocytes Absolute: 1 10*3/uL (ref 0.1–1.0)
Monocytes Relative: 7 %
Neutro Abs: 9.8 10*3/uL — ABNORMAL HIGH (ref 1.7–7.7)
Neutrophils Relative %: 75 %
Platelets: 926 10*3/uL (ref 150–400)
RBC: 3.58 MIL/uL — ABNORMAL LOW (ref 3.87–5.11)
RDW: 14.5 % (ref 11.5–15.5)
WBC: 13.2 10*3/uL — ABNORMAL HIGH (ref 4.0–10.5)
nRBC: 0 % (ref 0.0–0.2)

## 2023-02-03 LAB — URINALYSIS, COMPLETE (UACMP) WITH MICROSCOPIC
Bacteria, UA: NONE SEEN
Bilirubin Urine: NEGATIVE
Glucose, UA: NEGATIVE mg/dL
Hgb urine dipstick: NEGATIVE
Ketones, ur: NEGATIVE mg/dL
Leukocytes,Ua: NEGATIVE
Nitrite: NEGATIVE
Protein, ur: NEGATIVE mg/dL
Specific Gravity, Urine: 1.016 (ref 1.005–1.030)
pH: 6 (ref 5.0–8.0)

## 2023-02-03 LAB — COMPREHENSIVE METABOLIC PANEL
ALT: 30 U/L (ref 0–44)
AST: 58 U/L — ABNORMAL HIGH (ref 15–41)
Albumin: 3.3 g/dL — ABNORMAL LOW (ref 3.5–5.0)
Alkaline Phosphatase: 83 U/L (ref 38–126)
Anion gap: 15 (ref 5–15)
BUN: 11 mg/dL (ref 6–20)
CO2: 23 mmol/L (ref 22–32)
Calcium: 9.1 mg/dL (ref 8.9–10.3)
Chloride: 97 mmol/L — ABNORMAL LOW (ref 98–111)
Creatinine, Ser: 0.56 mg/dL (ref 0.44–1.00)
GFR, Estimated: >60 mL/min (ref >60)
Glucose, Bld: 86 mg/dL (ref 70–99)
Potassium: 3.3 mmol/L — ABNORMAL LOW (ref 3.5–5.1)
Sodium: 135 mmol/L (ref 135–145)
Total Bilirubin: 0.4 mg/dL (ref 0.3–1.2)
Total Protein: 8.9 g/dL — ABNORMAL HIGH (ref 6.5–8.1)

## 2023-02-03 LAB — POCT URINE DRUG SCREEN - MANUAL ENTRY (I-SCREEN)
POC Amphetamine UR: POSITIVE — AB
POC Buprenorphine (BUP): NOT DETECTED
POC Cocaine UR: NOT DETECTED
POC Marijuana UR: POSITIVE — AB
POC Methadone UR: NOT DETECTED
POC Methamphetamine UR: POSITIVE — AB
POC Morphine: NOT DETECTED
POC Oxazepam (BZO): NOT DETECTED
POC Oxycodone UR: NOT DETECTED
POC Secobarbital (BAR): NOT DETECTED

## 2023-02-03 LAB — LIPID PANEL
Cholesterol: 159 mg/dL (ref 0–200)
HDL: 44 mg/dL (ref >40)
LDL Cholesterol: 101 mg/dL — ABNORMAL HIGH (ref 0–99)
Total CHOL/HDL Ratio: 3.6 {ratio}
Triglycerides: 69 mg/dL (ref <150)
VLDL: 14 mg/dL (ref 0–40)

## 2023-02-03 LAB — HEPATITIS PANEL, ACUTE
HCV Ab: REACTIVE — AB
Hep A IgM: NONREACTIVE
Hep B C IgM: NONREACTIVE
Hepatitis B Surface Ag: NONREACTIVE

## 2023-02-03 LAB — POCT PREGNANCY, URINE: Preg Test, Ur: NEGATIVE

## 2023-02-03 LAB — ETHANOL: Alcohol, Ethyl (B): <10 mg/dL (ref <10)

## 2023-02-03 LAB — HIV ANTIBODY (ROUTINE TESTING W REFLEX): HIV Screen 4th Generation wRfx: NONREACTIVE

## 2023-02-03 LAB — TSH: TSH: 0.291 u[IU]/mL — ABNORMAL LOW (ref 0.350–4.500)

## 2023-02-03 LAB — MAGNESIUM: Magnesium: 1.8 mg/dL (ref 1.7–2.4)

## 2023-02-03 MED ORDER — METHOCARBAMOL 500 MG PO TABS: 500.0000 mg | ORAL_TABLET | Freq: Three times a day (TID) | ORAL | Status: DC | PRN

## 2023-02-03 MED ORDER — DICYCLOMINE HCL 20 MG PO TABS: 20.0000 mg | ORAL_TABLET | Freq: Four times a day (QID) | ORAL | Status: DC | PRN

## 2023-02-03 MED ORDER — ACETAMINOPHEN 325 MG PO TABS: 650.0000 mg | ORAL_TABLET | Freq: Four times a day (QID) | ORAL | Status: DC | PRN

## 2023-02-03 MED ORDER — ONDANSETRON 4 MG PO TBDP: 4.0000 mg | ORAL_TABLET | Freq: Four times a day (QID) | ORAL | Status: DC | PRN

## 2023-02-03 MED ORDER — NAPROXEN 500 MG PO TABS: 500.0000 mg | ORAL_TABLET | Freq: Two times a day (BID) | ORAL | Status: DC | PRN

## 2023-02-03 MED ORDER — MAGNESIUM HYDROXIDE 400 MG/5ML PO SUSP: 30.0000 mL | Freq: Every day | ORAL | Status: DC | PRN

## 2023-02-03 MED ORDER — HYDROXYZINE HCL 25 MG PO TABS: 25.0000 mg | ORAL_TABLET | Freq: Three times a day (TID) | ORAL | Status: DC | PRN

## 2023-02-03 MED ORDER — ACETAMINOPHEN 325 MG PO TABS
650.0000 mg | ORAL_TABLET | Freq: Four times a day (QID) | ORAL | Status: DC | PRN
  Administered 2023-02-04: 650 mg via ORAL
  Filled 2023-02-03: qty 2

## 2023-02-03 MED ORDER — THIAMINE MONONITRATE 100 MG PO TABS: 100.0000 mg | ORAL_TABLET | Freq: Every day | ORAL | Status: DC

## 2023-02-03 MED ORDER — NICOTINE 7 MG/24HR TD PT24: 7.0000 mg | MEDICATED_PATCH | Freq: Every day | TRANSDERMAL | Status: DC | PRN

## 2023-02-03 MED ORDER — CLONIDINE HCL 0.1 MG PO TABS: 0.1000 mg | ORAL_TABLET | Freq: Every day | ORAL | Status: DC

## 2023-02-03 MED ORDER — ALUM & MAG HYDROXIDE-SIMETH 200-200-20 MG/5ML PO SUSP: 30.0000 mL | ORAL | Status: DC | PRN

## 2023-02-03 MED ORDER — ADULT MULTIVITAMIN W/MINERALS CH
1.0000 | ORAL_TABLET | Freq: Every day | ORAL | Status: DC
  Administered 2023-02-03: 1 via ORAL
  Filled 2023-02-03: qty 1

## 2023-02-03 MED ORDER — LORAZEPAM 1 MG PO TABS: 1.0000 mg | ORAL_TABLET | Freq: Four times a day (QID) | ORAL | Status: DC | PRN

## 2023-02-03 MED ORDER — CLONIDINE HCL 0.1 MG PO TABS: 0.1000 mg | ORAL_TABLET | ORAL | Status: DC

## 2023-02-03 MED ORDER — LOPERAMIDE HCL 2 MG PO CAPS: 2.0000 mg | ORAL_CAPSULE | ORAL | Status: DC | PRN

## 2023-02-03 MED ORDER — TRAZODONE HCL 50 MG PO TABS: 50.0000 mg | ORAL_TABLET | Freq: Every evening | ORAL | Status: DC | PRN

## 2023-02-03 MED ORDER — CLONIDINE HCL 0.1 MG PO TABS
0.1000 mg | ORAL_TABLET | Freq: Four times a day (QID) | ORAL | Status: DC
  Administered 2023-02-03: 0.1 mg via ORAL
  Filled 2023-02-03 (×2): qty 1

## 2023-02-03 MED ORDER — TRAZODONE HCL 50 MG PO TABS
50.0000 mg | ORAL_TABLET | Freq: Every evening | ORAL | Status: DC | PRN
  Administered 2023-02-04: 50 mg via ORAL
  Filled 2023-02-03: qty 1
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
## 2023-02-03 NOTE — Progress Notes (Signed)
   02/03/23 1134  [facility] Triage Screening (Walk-ins at [facility] only)  How Did You Hear About [place]? Family/Friend  What Is the Reason for Your Visit/Call Today? Sherica Frometa is a 32 year old female presenting to [facility] accompanied by her brother. Pts brother reports she is an addict and she needs help. Pt reports that she is coming off of Meth and Heroin. Pt reports that she has been struggling with her addiction for "over a decade". Pt reports that she smoked meth 2-3 times last night. Pt recalls that she smoked from a water bubbler. Pt reports that she smokes meth and heroin daily. Pt reports that she just buried her sister in September and has felt increased depression and anxiety. Pt does report to have moderate depression and anxiety. Pt reports that she had 3 shots of alcohol 2 days ago, but reports no hx with alcohol use. Pt is currently taking no medication at this time. Pt has no known mental health disorders at this time. Pt denies SI, HI and AVH.  How Long Has This Been Causing You Problems? > than 6 months  Have You Recently Had Any Thoughts About Hurting Yourself? No  Are You Planning to Commit Suicide/Harm Yourself At This time? No  Have you Recently Had Thoughts About Hurting Someone [name]? No  Are You Planning To Harm Someone At This Time? No  Are you currently experiencing any auditory, visual or other hallucinations? No  Have You Used Any Alcohol or Drugs in the Past 24 Hours? Yes  How long ago did you use Drugs or Alcohol? last night  What Did You Use and How Much? smoked meth, 2-3 times of a water bubbler  Do you have any current medical co-morbidities that require immediate attention? No  Clinician description of patient physical appearance/behavior: tearful, cooperative, anxious  What Do You Feel Would Help You the Most Today? Alcohol or Drug Use Treatment  If access to [facility] was not available, would you have sought care in the Emergency Department? No  Determination of Need  Urgent (48 hours)  Options For Referral Facility-Based Crisis;Intensive Outpatient Therapy

## 2023-02-03 NOTE — ED Notes (Addendum)
At 1634 received a call from [facility] at [facility] lab to report a critical PLT of 926. Message to providers to alert. Responses were to send patient out for medical clearance. Geralynn Ochs, NP stated she was reaching out to Dr. Lucianne Muss for treatment plan.

## 2023-02-03 NOTE — ED Notes (Signed)
Pt platelets are high in 926. From the day shift RN, day shift providers were notified. This RN spoke to the pt to notify this RN when she feels any numbness, shortness of breath and nausea, weakness, bleeding, headache, dizziness, chest pain, weakness after pt denying all these. Will continue to monitor for safety and provide support.

## 2023-02-03 NOTE — ED Notes (Signed)
Patient is sleeping. Respirations equal and unlabored, skin warm and dry. No change in assessment or acuity. Routine safety checks conducted according to facility protocol. Will continue to monitor for safety.   

## 2023-02-03 NOTE — ED Notes (Signed)
Patient is A&O x 4. Denies HA, visual disturbances, impaired mobility, chest discomfort, body aches and cramps

## 2023-02-03 NOTE — ED Provider Notes (Signed)
Patient RN reported the patient had an increased platelet count of 926.  This provider went to observe patient, patient resting in her room in the [facility], patient appeared to be in no acute distress, laying on the bed reading a book.  Patient appears asymptomatic.  Patient denied any physical symptoms, denied any chest pain or shortness of breath, no nausea, or difficulty breathing, patient denies having any pain.  Consult with Dr. Lucianne Muss, and Dr. Adela Lank (who is the ED P at [facility]), informed them this patient has had an increased platelet count, in the past 7 days and has almost doubled, also noted that patient was admitted to [facility] on January 20, 2023 for a subdural hematoma. Per Dr. Adela Lank there is nothing we would do acutely, usually would follow up with PCP and maybe hematology.  We will have the labs rechecked in about a week or 2.

## 2023-02-03 NOTE — ED Notes (Signed)
Pt sitting in dining area watching tv at the current. No s/s of acute distress, pain or any discomfort at this time. VSS. Safety maintained. Will continue to monitor and report any COC.
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
## 2023-02-03 NOTE — ED Notes (Addendum)
Writer observed communication between [place], NP and Dr. Adela Lank at [facility]. Dr. Adela Lank advised that patient does not need to come to Ed for treatment. May need to f/u with PCP or Hematology or have labs repeated in  a week or two.
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
## 2023-02-03 NOTE — ED Notes (Signed)
Clonidine held r/t B/P 112/65.
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
## 2023-02-03 NOTE — ED Notes (Signed)
Pt is in the dayroom watching TV with peers. Pt denies SI/HI/AVH. No acute distress noted. Will continue to monitor for safety. 

## 2023-02-04 ENCOUNTER — Emergency Department (HOSPITAL_COMMUNITY)
Admission: EM | Admit: 2023-02-04 | Discharge: 2023-02-04 | Disposition: A | Discharge status: Other Acute Inpatient Hospital | Payer: Medicaid Other | Attending: Emergency Medicine | Admitting: Emergency Medicine

## 2023-02-04 ENCOUNTER — Other Ambulatory Visit (HOSPITAL_COMMUNITY)
Admission: EM | Admit: 2023-02-04 | Discharge: 2023-02-06 | Disposition: A | Discharge status: Home / Self Care | Payer: Medicaid Other | Source: Home / Self Care | Admitting: Student in an Organized Health Care Education/Training Program

## 2023-02-04 ENCOUNTER — Encounter (HOSPITAL_COMMUNITY): Payer: Self-pay

## 2023-02-04 ENCOUNTER — Emergency Department (HOSPITAL_COMMUNITY): Payer: Medicaid Other

## 2023-02-04 DIAGNOSIS — R519 Headache, unspecified: Secondary | ICD-10-CM | POA: Insufficient documentation

## 2023-02-04 DIAGNOSIS — F1590 Other stimulant use, unspecified, uncomplicated: Secondary | ICD-10-CM

## 2023-02-04 DIAGNOSIS — F1510 Other stimulant abuse, uncomplicated: Secondary | ICD-10-CM | POA: Insufficient documentation

## 2023-02-04 DIAGNOSIS — D7589 Other specified diseases of blood and blood-forming organs: Secondary | ICD-10-CM | POA: Diagnosis not present

## 2023-02-04 DIAGNOSIS — Z79899 Other long term (current) drug therapy: Secondary | ICD-10-CM | POA: Diagnosis not present

## 2023-02-04 DIAGNOSIS — Z7982 Long term (current) use of aspirin: Secondary | ICD-10-CM | POA: Diagnosis not present

## 2023-02-04 DIAGNOSIS — F17210 Nicotine dependence, cigarettes, uncomplicated: Secondary | ICD-10-CM | POA: Insufficient documentation

## 2023-02-04 DIAGNOSIS — D75839 Thrombocytosis, unspecified: Secondary | ICD-10-CM | POA: Insufficient documentation

## 2023-02-04 DIAGNOSIS — S065XAD Traumatic subdural hemorrhage with loss of consciousness status unknown, subsequent encounter: Secondary | ICD-10-CM | POA: Insufficient documentation

## 2023-02-04 DIAGNOSIS — F329 Major depressive disorder, single episode, unspecified: Secondary | ICD-10-CM | POA: Diagnosis not present

## 2023-02-04 DIAGNOSIS — D473 Essential (hemorrhagic) thrombocythemia: Secondary | ICD-10-CM | POA: Insufficient documentation

## 2023-02-04 DIAGNOSIS — R7989 Other specified abnormal findings of blood chemistry: Secondary | ICD-10-CM

## 2023-02-04 DIAGNOSIS — F1120 Opioid dependence, uncomplicated: Secondary | ICD-10-CM | POA: Insufficient documentation

## 2023-02-04 DIAGNOSIS — Z6372 Alcoholism and drug addiction in family: Secondary | ICD-10-CM | POA: Diagnosis not present

## 2023-02-04 DIAGNOSIS — Z5901 Sheltered homelessness: Secondary | ICD-10-CM | POA: Insufficient documentation

## 2023-02-04 DIAGNOSIS — S0300XD Dislocation of jaw, unspecified side, subsequent encounter: Secondary | ICD-10-CM | POA: Diagnosis not present

## 2023-02-04 DIAGNOSIS — F1190 Opioid use, unspecified, uncomplicated: Secondary | ICD-10-CM | POA: Diagnosis present

## 2023-02-04 LAB — CBC WITH DIFFERENTIAL/PLATELET
Abs Immature Granulocytes: 0.02 10*3/uL (ref 0.00–0.07)
Basophils Absolute: 0.1 10*3/uL (ref 0.0–0.1)
Basophils Relative: 1 %
Eosinophils Absolute: 0.2 10*3/uL (ref 0.0–0.5)
Eosinophils Relative: 2 %
HCT: 30.4 % — ABNORMAL LOW (ref 36.0–46.0)
Hemoglobin: 9.7 g/dL — ABNORMAL LOW (ref 12.0–15.0)
Immature Granulocytes: 0 %
Lymphocytes Relative: 32 %
Lymphs Abs: 3 10*3/uL (ref 0.7–4.0)
MCH: 28 pg (ref 26.0–34.0)
MCHC: 31.9 g/dL (ref 30.0–36.0)
MCV: 87.6 fL (ref 80.0–100.0)
Monocytes Absolute: 0.8 10*3/uL (ref 0.1–1.0)
Monocytes Relative: 8 %
Neutro Abs: 5.3 10*3/uL (ref 1.7–7.7)
Neutrophils Relative %: 57 %
Platelets: 805 10*3/uL — ABNORMAL HIGH (ref 150–400)
RBC: 3.47 MIL/uL — ABNORMAL LOW (ref 3.87–5.11)
RDW: 14.5 % (ref 11.5–15.5)
WBC: 9.3 10*3/uL (ref 4.0–10.5)
nRBC: 0 % (ref 0.0–0.2)

## 2023-02-04 LAB — BASIC METABOLIC PANEL
Anion gap: 10 (ref 5–15)
BUN: 5 mg/dL — ABNORMAL LOW (ref 6–20)
CO2: 22 mmol/L (ref 22–32)
Calcium: 8.7 mg/dL — ABNORMAL LOW (ref 8.9–10.3)
Chloride: 104 mmol/L (ref 98–111)
Creatinine, Ser: 0.5 mg/dL (ref 0.44–1.00)
GFR, Estimated: >60 mL/min (ref >60)
Glucose, Bld: 102 mg/dL — ABNORMAL HIGH (ref 70–99)
Potassium: 3.7 mmol/L (ref 3.5–5.1)
Sodium: 136 mmol/L (ref 135–145)

## 2023-02-04 LAB — PROLACTIN: Prolactin: 13.3 ng/mL (ref 4.8–33.4)

## 2023-02-04 MED ORDER — METHOCARBAMOL 500 MG PO TABS: 500.0000 mg | ORAL_TABLET | Freq: Three times a day (TID) | ORAL | Status: DC | PRN

## 2023-02-04 MED ORDER — ADULT MULTIVITAMIN W/MINERALS CH
1.0000 | ORAL_TABLET | Freq: Every day | ORAL | Status: DC
  Administered 2023-02-04 – 2023-02-05 (×2): 1 via ORAL
  Filled 2023-02-04 (×2): qty 1
  Filled 2023-02-04: qty 7

## 2023-02-04 MED ORDER — ASPIRIN 81 MG PO TBEC
81.0000 mg | DELAYED_RELEASE_TABLET | Freq: Every day | ORAL | Status: DC
  Administered 2023-02-04 – 2023-02-05 (×2): 81 mg via ORAL
  Filled 2023-02-04: qty 1
  Filled 2023-02-04: qty 7
  Filled 2023-02-04: qty 1

## 2023-02-04 MED ORDER — ALUM & MAG HYDROXIDE-SIMETH 200-200-20 MG/5ML PO SUSP
30.0000 mL | ORAL | Status: DC | PRN
Start: 2023-02-04 — End: 2023-02-06

## 2023-02-04 MED ORDER — ACETAMINOPHEN 325 MG PO TABS
650.0000 mg | ORAL_TABLET | Freq: Four times a day (QID) | ORAL | Status: DC | PRN
  Administered 2023-02-04 – 2023-02-05 (×2): 650 mg via ORAL
  Filled 2023-02-04 (×2): qty 2

## 2023-02-04 MED ORDER — DICYCLOMINE HCL 20 MG PO TABS: 20.0000 mg | ORAL_TABLET | Freq: Four times a day (QID) | ORAL | Status: DC | PRN

## 2023-02-04 MED ORDER — LEVETIRACETAM 500 MG PO TABS
500.0000 mg | ORAL_TABLET | Freq: Two times a day (BID) | ORAL | Status: DC
  Administered 2023-02-04 – 2023-02-05 (×4): 500 mg via ORAL
  Filled 2023-02-04: qty 1
  Filled 2023-02-04: qty 14
  Filled 2023-02-04 (×3): qty 1

## 2023-02-04 MED ORDER — NAPROXEN 500 MG PO TABS
500.0000 mg | ORAL_TABLET | Freq: Two times a day (BID) | ORAL | Status: DC | PRN
  Administered 2023-02-04: 500 mg via ORAL
  Filled 2023-02-04: qty 1

## 2023-02-04 MED ORDER — HYDROXYZINE HCL 25 MG PO TABS
25.0000 mg | ORAL_TABLET | Freq: Three times a day (TID) | ORAL | Status: DC | PRN
  Administered 2023-02-04: 25 mg via ORAL
  Filled 2023-02-04: qty 1

## 2023-02-04 MED ORDER — TRAZODONE HCL 50 MG PO TABS
50.0000 mg | ORAL_TABLET | Freq: Every evening | ORAL | Status: DC | PRN
Start: 2023-02-04 — End: 2023-02-06
  Administered 2023-02-04: 50 mg via ORAL
  Filled 2023-02-04: qty 1

## 2023-02-04 MED ORDER — THIAMINE MONONITRATE 100 MG PO TABS
100.0000 mg | ORAL_TABLET | Freq: Every day | ORAL | Status: DC
Start: 2023-02-05 — End: 2023-02-06
  Administered 2023-02-05: 100 mg via ORAL
  Filled 2023-02-04: qty 7
  Filled 2023-02-04: qty 1

## 2023-02-04 MED ORDER — ONDANSETRON 4 MG PO TBDP: 4.0000 mg | ORAL_TABLET | Freq: Four times a day (QID) | ORAL | Status: DC | PRN

## 2023-02-04 MED ORDER — ASPIRIN 81 MG PO CHEW: 81.0000 mg | CHEWABLE_TABLET | Freq: Every day | ORAL | 2 refills | Status: DC

## 2023-02-04 MED ORDER — MAGNESIUM HYDROXIDE 400 MG/5ML PO SUSP
30.0000 mL | Freq: Every day | ORAL | Status: DC | PRN
Start: 2023-02-04 — End: 2023-02-06

## 2023-02-04 MED ORDER — NICOTINE 7 MG/24HR TD PT24
7.0000 mg | MEDICATED_PATCH | Freq: Every day | TRANSDERMAL | Status: DC | PRN
  Filled 2023-02-04: qty 14

## 2023-02-04 MED ORDER — LORAZEPAM 1 MG PO TABS: 1.0000 mg | ORAL_TABLET | Freq: Four times a day (QID) | ORAL | Status: DC | PRN

## 2023-02-04 MED ORDER — LOPERAMIDE HCL 2 MG PO CAPS: 2.0000 mg | ORAL_CAPSULE | ORAL | Status: DC | PRN
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
## 2023-02-04 NOTE — ED Notes (Signed)
Pt sleeping in no acute distress. RR even and unlabored. Environment secured. Will continue to monitor for safety. 

## 2023-02-04 NOTE — ED Notes (Signed)
Pt transported to ED

## 2023-02-04 NOTE — ED Notes (Signed)
Report has been called to [facility] at [facility].
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
## 2023-02-04 NOTE — ED Notes (Addendum)
RN went to room to ask what meds she was asking for and pt said never mind I feel better.   Pt. Has been acting strange the past hour now going in and out the room randomly brushing her teeth waking up asking for scrubs and pacing the unit.   Pt just came out the room again asking for meds her head really hurts even tho the RN asked if she wanted something for it she said no she was fine. RN is now getting her something for her head.   Pt also keeps shutting the door and MHT keeps opening it.

## 2023-02-04 NOTE — ED Notes (Signed)
EMS has been called again
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
## 2023-02-04 NOTE — ED Notes (Signed)
Writer called Victorino Dike, RN, the [facility] charge nurse regarding pt reason for being transferred. This Clinical research associate called report to the [place], Charity fundraiser. , the charge nurse around 4: 20 am, informing her of pt coming, for medical clearance of high platelets of 926,000, but per  ED nurse, report and chief complaint in the ED notes, it says headache. Writer asked Victorino Dike if she knows about it, and [place], RN said, they do not have a prove that it is high platelets. The writer inform Ardine Eng to check the result review for that information to get the prove so pt will have the right treatment.
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
## 2023-02-04 NOTE — ED Notes (Addendum)
Pt came up to nursing station asking for scrubs. MHT went and got scrubs and put them in the pt's room. Also asked for meds. For a headache told her when the nurse gets back I'll let her know.

## 2023-02-04 NOTE — ED Provider Notes (Signed)
Writer was notify by nursing staff that patient had increase headache,  pt platelet count was 926 (critical high).  Writer spoke with Dr Manus Gunning MD at [facility] who will be expecting the patient.  Pt will be sent for medical clearance.
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
## 2023-02-04 NOTE — ED Notes (Signed)
Patient was provided lunch.

## 2023-02-04 NOTE — ED Notes (Signed)
Pt came to nursing station complaining of headache, pain of 8/10 and finding it difficult to sleep. Tylenol and trazodone was administered to that effect.

## 2023-02-04 NOTE — ED Triage Notes (Signed)
Pt presents [facility] from [facility] following a complaint of a headache. Pt told EMS that staff there gave her 650 mg tylenol and the headache went away and she didn't want to come to the hospital but the facility also found that her WBC's were elevated and told her that if she didn't come to the hospital to be evaluated they would discharge her. Pt is voluntary at [facility], hx of substance abuse.   Paperwork sent from [facility] does not have any lab results in it.  Pt in no pain at this time, states she just wants to sleep and really does not want to be here.

## 2023-02-04 NOTE — ED Provider Notes (Signed)
Write was notify that when EMS arrive, they refuse to transport patient.  Writer did go to the unit and spoke with patient encouraging the patient to go to the hospital because of her platelet count and because she had complained about her headache and restlessness.  While talking to the patient EMS was asking the nurses question whether or not the patient was IVC need.  While Clinical research associate was talking with patient I have overheard EMS and then not taking the patient because the patient is refusing.  Writer left the unit and spoke with the charge nurse because EMS had refused to take the patient to the hospital.

## 2023-02-04 NOTE — ED Notes (Addendum)
Patient is sleeping. Respirations equal and unlabored, skin warm and dry. No change in assessment or acuity. Routine safety checks conducted according to facility protocol. Will continue to monitor for safety.   

## 2023-02-04 NOTE — ED Notes (Signed)
Pt is being transported to [facility] for medical clearance of platelets of 926,000. Pt complained of headache of 8/10, which tylenol of 650mg  was administered, but during reassessing, pt still state it is hurting bad and it is located at the right side of her head, and feeling restless.pt will return to [facility], after medically cleared per York Pellant, NP orders.

## 2023-02-04 NOTE — ED Provider Notes (Signed)
Writer saw patient face to face,  pt stated she feels better and still a little restless.  Nursing staff made aware that patient will be going to the ED from medical clearance.
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
## 2023-02-04 NOTE — ED Notes (Signed)
Patient is sleeping. Respirations equal and unlabored, skin warm and dry. No change in assessment or acuity. Routine safety checks conducted according to facility protocol. Will continue to monitor for safety.   

## 2023-02-04 NOTE — ED Notes (Signed)
 Patient was provided dinner

## 2023-02-04 NOTE — Discharge Instructions (Addendum)
Allison Woodard's workup included blood test and a CT scan of the pain.  Her CT scan shows a stable and somewhat improving subdural bleed which is chronic from 3 weeks ago.  There is no worsening evidence of bleeding.  She did not require hospitalization for this injury at this time.  Her blood test show that she continues to have high platelet counts.  Platelets were 805 today.  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  He did recommend starting 81 mg of aspirin which has been prescribed to her pharmacy as an outpatient.  This would be a daily dose of aspirin.  A referral was also placed to hematology and their office will help coordinate an appointment with the patient after she finishes her rehab.  The patient is wishing to return to rehab at this time.

## 2023-02-04 NOTE — ED Notes (Signed)
MHT informed this RN pt came asking for meds, this RN went to pt room to ask pt what meds she needs and what is going on with her, pt answered "  never mind, I don't need any medication, I'm fine". This RN told pt to let her know whenever she needs the RN.
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
## 2023-02-04 NOTE — ED Notes (Signed)
Writer called Victorino Dike, RN, the [facility] charge nurse regarding pt reason for being transferred. This Clinical research associate called report to the [place], Charity fundraiser, the charge informing her of pt coming, for medical clearance of high platelets of 926,000, but per report and chief complaint in the ED notes, it says headache. Writer asked Victorino Dike if she knows about it, and [place], RN said, they do not have a prove them it is high platelets. The writer inform Ardine Eng to check the result review for that information to get the prove so pt will have the right treatment.

## 2023-02-04 NOTE — ED Notes (Signed)
Patient observed/assessed at bedside lying in bed asleep. Patient alert and oriented to self and location. Affect is flat and patient seems agitated to be disturbed from sleeping. He denies A/V/H. He denies having any thoughts/plan of self harm and harm towards others. Verbalizes no further complaints at this time. Will continue to monitor and support.

## 2023-02-04 NOTE — ED Notes (Signed)
Pt came out looking for the nurse again asking if she's came back and that she really needs something for her head.
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
## 2023-02-04 NOTE — BH IP Treatment Plan (Signed)
Interdisciplinary Treatment and Diagnostic Plan Update  02/04/2023 Time of Session: 11:42AM Allison Woodard MRN: [id number]  Diagnosis:  Final diagnoses:  Opioid use disorder, severe, dependence (HCC)  Stimulant use disorder     Current Medications:  Current Facility-Administered Medications  Medication Dose Route Frequency Provider Last Rate Last Admin   acetaminophen (TYLENOL) tablet 650 mg  650 mg Oral Q6H PRN Carrion-Carrero, Margely, MD       alum & mag hydroxide-simeth (MAALOX/MYLANTA) 200-200-20 MG/5ML suspension 30 mL  30 mL Oral Q4H PRN Carrion-Carrero, Margely, MD       dicyclomine (BENTYL) tablet 20 mg  20 mg Oral Q6H PRN Carrion-Carrero, Margely, MD       hydrOXYzine (ATARAX) tablet 25 mg  25 mg Oral TID PRN Carrion-Carrero, Margely, MD       loperamide (IMODIUM) capsule 2-4 mg  2-4 mg Oral PRN Carrion-Carrero, Margely, MD       LORazepam (ATIVAN) tablet 1 mg  1 mg Oral Q6H PRN Carrion-Carrero, Margely, MD       magnesium hydroxide (MILK OF MAGNESIA) suspension 30 mL  30 mL Oral Daily PRN Carrion-Carrero, Margely, MD       methocarbamol (ROBAXIN) tablet 500 mg  500 mg Oral Q8H PRN Carrion-Carrero, Margely, MD       multivitamin with minerals tablet 1 tablet  1 tablet Oral Daily Carrion-Carrero, Margely, MD       naproxen (NAPROSYN) tablet 500 mg  500 mg Oral BID PRN Carrion-Carrero, Margely, MD       ondansetron (ZOFRAN-ODT) disintegrating tablet 4 mg  4 mg Oral Q6H PRN Carrion-Carrero, Margely, MD       [START ON 02/05/2023] thiamine (VITAMIN B1) tablet 100 mg  100 mg Oral Daily Carrion-Carrero, Margely, MD       traZODone (DESYREL) tablet 50 mg  50 mg Oral QHS PRN Carrion-Carrero, Margely, MD       Current Outpatient Medications  Medication Sig Dispense Refill   aspirin 81 MG chewable tablet Chew 1 tablet (81 mg total) by mouth daily. 30 tablet 2   levETIRAcetam (KEPPRA) 500 MG tablet Take 1 tablet (500 mg total) by mouth 2 (two) times daily for 3 doses. 3 tablet 0    oxyCODONE (ROXICODONE) 5 MG immediate release tablet Take 1 tablet (5 mg total) by mouth every 6 (six) hours as needed for severe pain (pain score 7-10). (Patient not taking: Reported on 02/03/2023) 10 tablet 0   PTA Medications: Prior to Admission medications   Medication Sig Start Date End Date Taking? Authorizing Provider  aspirin 81 MG chewable tablet Chew 1 tablet (81 mg total) by mouth daily. 02/04/23 03/06/23  Terald Sleeper, MD  levETIRAcetam (KEPPRA) 500 MG tablet Take 1 tablet (500 mg total) by mouth 2 (two) times daily for 3 doses. 01/27/23 01/29/23  Juliet Rude, PA-C  oxyCODONE (ROXICODONE) 5 MG immediate release tablet Take 1 tablet (5 mg total) by mouth every 6 (six) hours as needed for severe pain (pain score 7-10). Patient not taking: Reported on 02/03/2023 01/27/23   Juliet Rude, PA-C    Patient Stressors: Substance abuse    Patient Strengths: Ability for insight  Average or above average intelligence  Capable of independent living  Communication skills  General fund of knowledge  Supportive family/friends   Treatment Modalities: Medication Management, Group therapy, Case management,  1 to 1 session with clinician, Psychoeducation, Recreational therapy.   Physician Treatment Plan for Primary and Secondary Diagnosis:  Final diagnoses:  Opioid use disorder,  severe, dependence (HCC)  Stimulant use disorder   Long Term Goal(s):    Short Term Goals:    Medication Management: Evaluate patient's response, side effects, and tolerance of medication regimen.  Therapeutic Interventions: 1 to 1 sessions, Unit Group sessions and Medication administration.  Evaluation of Outcomes: Progressing  LCSW Treatment Plan for Primary Diagnosis:  Final diagnoses:  Opioid use disorder, severe, dependence (HCC)  Stimulant use disorder    Long Term Goal(s): Safe transition to appropriate next level of care at discharge.  Short Term Goals: Facilitate acceptance of  mental health diagnosis and concerns through verbal commitment to aftercare plan and appointments at discharge., Patient will identify one social support prior to discharge to aid in patient's recovery., Patient will attend AA/NA groups as scheduled., Identify minimum of 2 triggers associated with mental health/substance abuse issues with treatment team members., and Increase skills for wellness and recovery by attending 50% of scheduled groups.  Therapeutic Interventions: Assess for all discharge needs, 1 to 1 time with Child psychotherapist, Explore available resources and support systems, Assess for adequacy in community support network, Educate family and significant other(s) on suicide prevention, Complete Psychosocial Assessment, Interpersonal group therapy.  Evaluation of Outcomes: Progressing   Progress in Treatment: Attending groups: Yes. Participating in groups: Yes. Taking medication as prescribed: Yes. Toleration medication: Yes. Family/Significant other contact made: No, will contact:  Patient's brother once permission has been provided. LCSW will follow up with patient at a later time to gain collateral from family Patient understands diagnosis: Yes. Discussing patient identified problems/goals with staff: Yes. Medical problems stabilized or resolved: Yes. Denies suicidal/homicidal ideation: Yes. Issues/concerns per patient self-inventory: Yes. Other: substance use and need for further treatment.   New problem(s) identified: No, Describe:  other than reported on admission.   New Short Term/Long Term Goal(s): Safe transition to appropriate next level of care at discharge, Engage patient in therapeutic group addressing interpersonal concerns. Engage patient in aftercare planning with referrals and resources, Increase ability to appropriately verbalize feelings, Facilitate acceptance of mental health diagnosis and concerns and Identify triggers associated with mental health/substance abuse  issues.    Patient Goals:  Patient initially reported interest in continuing with outpatient treatment. Treatment team discussed current recommendation for residential placement due to the extensiveness of use. Patient expressed understanding and Clinician provided more insight regarding the programming and expectations of residential placement. Patient reports being in agreement with current recommendation and has provided LCSW with permission to send her clinicals out for review with hopes to secure placement within 3-5 days.   Discharge Plan or Barriers: LCSW will send referrals out for review for residential placement. Updates will be provided as received.   Reason for Continuation of Hospitalization: Medication stabilization Withdrawal symptoms  Estimated Length of Stay: 3-5 days  Last 3 [place] Suicide Severity Risk Score: Flowsheet Row ED from 02/04/2023 in [facility] Emergency Department at [facility] Most recent reading at 02/04/2023  7:02 AM ED from 02/03/2023 in [facility] Most recent reading at 02/03/2023  2:38 PM ED from 02/03/2023 in [facility] Most recent reading at 02/03/2023 11:55 AM  C-SSRS RISK CATEGORY No Risk No Risk No Risk       Last PHQ 2/9 Scores:    02/04/2023   12:02 PM 02/03/2023    3:27 PM 02/03/2023    1:54 PM  Depression screen PHQ 2/9  Decreased Interest 2 2 1   Down, Depressed, Hopeless 1 1 1   PHQ -  2 Score 3 3 2   Altered sleeping 3 3 1   Tired, decreased energy 2 1 1   Change in appetite 1 1 1   Feeling bad or failure about yourself  3 3 2   Trouble concentrating 2 2 1   Moving slowly or fidgety/restless 2 1 0  Suicidal thoughts 1 0 0  PHQ-9 Score 17 14 8   Difficult doing work/chores Not difficult at all Very difficult Somewhat difficult    Scribe for Treatment Team: Loleta Dicker, LCSW 02/04/2023 12:59 PM

## 2023-02-04 NOTE — ED Notes (Addendum)
Pt transferred back from [facility] to [facility], after being sent over for medical clearance d/t critical platelet lab value >900 and pt c/o HA. Pt easily agitated arguing with staff about reasoning for another skin assessment. Writer and MHT explained protocol for all pt's entering [facility] unit. Pt rolled her eyes. Pt gave short answers to questions being asked. Pt requesting detox from meth and heroin. Pt denies withdrawal sx at present. Skin assessment completed. Meal and drink offered. Pt denies SI/HI/AVH. Pt verbally contract for safety. Will monitor for safety.

## 2023-02-04 NOTE — ED Notes (Signed)
Patient discharged by paramedic. Patient verbalizes understanding. Transported via Psychologist, educational to [employer]. Report given to Vernona Rieger RN at [facility]. Ambulatory to safe transport vehicle.

## 2023-02-05 MED ORDER — LEVETIRACETAM 500 MG PO TABS: 500.0000 mg | ORAL_TABLET | Freq: Two times a day (BID) | ORAL | 0 refills | Status: DC

## 2023-02-05 MED ORDER — NICOTINE 7 MG/24HR TD PT24: 7.0000 mg | MEDICATED_PATCH | Freq: Every day | TRANSDERMAL | 0 refills | Status: DC | PRN

## 2023-02-05 MED ORDER — ASPIRIN 81 MG PO TBEC: 81.0000 mg | DELAYED_RELEASE_TABLET | Freq: Every day | ORAL | 12 refills | Status: DC

## 2023-02-05 MED ORDER — ASPIRIN 81 MG PO TBEC: 81.0000 mg | DELAYED_RELEASE_TABLET | Freq: Every day | ORAL | 0 refills | Status: DC

## 2023-02-05 MED ORDER — ADULT MULTIVITAMIN W/MINERALS CH: 1.0000 | ORAL_TABLET | Freq: Every day | ORAL | 0 refills | Status: DC

## 2023-02-05 MED ORDER — VITAMIN B-1 100 MG PO TABS: 100.0000 mg | ORAL_TABLET | Freq: Every day | ORAL | 0 refills | Status: DC

## 2023-02-05 NOTE — Progress Notes (Signed)
Pt slept most of the shift however woke up for meals. No distress noted or concerns voiced. Staff will monitor for pt's safety.

## 2023-02-05 NOTE — Discharge Planning (Signed)
Social work Tax inspector and LCSW went and spoke to the patient at his bedside.  Patient was observed lying in bed, however when prompted she arose and spoke with Child psychotherapist. Patient confirmed that she was open to [facility] if accepted. Social work Tax inspector and [employer] contacted [employer] and spoke with Marcelino Duster and she confirmed referral was received and is currently being reviewed by team.  Marcelino Duster confirmed that patient has [facility]. Social Worker will provide update once received. Social Work Tax inspector followed up with [facility] and [facility] regarding referral.  Social Work Intern was informed of [place] for patient to [employer] if interested: [phone]. Social work Tax inspector went and spoke with patient about these options and patient reports she would prefer to wait on [facility] response. No other needs reported at this time.     Purcell Mouton -  Social Work Intern [phone]
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
## 2023-02-05 NOTE — ED Notes (Signed)
Patient received lunch

## 2023-02-05 NOTE — ED Notes (Signed)
Patient is sleeping. Respirations equal and unlabored, skin warm and dry. No change in assessment or acuity. Routine safety checks conducted according to facility protocol. Will continue to monitor for safety.   

## 2023-02-05 NOTE — ED Notes (Signed)
Patient provided dinner.

## 2023-02-05 NOTE — ED Notes (Signed)
Patient is sleeping. Respirations equal and unlabored, skin warm and dry, NAD. No change in assessment or acuity. Routine safety checks conducted according to facility protocol. Will continue to monitor for safety.   

## 2023-02-05 NOTE — Progress Notes (Signed)
Pt is awake, alert and oriented X3. Pt complained of headache on the scale of 7/10. No signs of acute distress noted. PRN Acetaminophen and scheduled meds administer per order.  Pt denies current SI/HI/AVH, plan or intent. Staff will monitor for pt's safety.
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
## 2023-02-05 NOTE — Discharge Planning (Signed)
Referral was received and per Marcelino Duster, patient has been accepted and can transfer to the facility on tomorrow by 9:00am. Update has been provided to the patient and MD made aware. Patient will need a 7-10 day supply of medication and one month refill. No nicotine gum allowed, however 14-30 day nicotine patches to be provided if needed. No other needs to report at this time.    LCSW will continue to follow up and provide updates as received.    Fernande Boyden, LCSW Clinical Social Worker [facility] Ph: [phone],

## 2023-02-05 NOTE — ED Notes (Signed)
Patient eating breakfast. °

## 2023-02-05 NOTE — Discharge Instructions (Signed)
Patient will discharge to [facility]: [address] [place], [place] [address] on tomorrow 10/31 by 9:00am with transportation provided by [facility].    [facility] [address], [place], [address] [phone] phone  New Patient Assessment/Therapy Walk-Ins:  Monday and Wednesday: 8 am until slots are full. Every 1st and 2nd Fridays of the month: 1 pm - 5 pm.  NO ASSESSMENT/THERAPY WALK-INS ON TUESDAYS OR THURSDAYS  New Patient Assessment/Medication Management Walk-Ins:  Monday - Friday:  8 am - 11 am.  For all walk-ins, we ask that you arrive by 7:30 am because patients will be seen in the order of arrival.  Availability is limited; therefore, you may not be seen on the same day that you walk-in.  Our goal is to serve and meet the needs of our community to the best of our ability.  SUBSTANCE USE TREATMENT for Medicaid and State Funded/IPRS  [facility] [address], [place], [address] [phone] phone NOTE: [facility] is no longer offering IOP services.  Serves those who are low-income or have no insurance.  [facility] [address] [place], [place], [address] [phone] phone [phone] fax NOTE: Does have Substance Abuse-Intensive Outpatient Program [facility]) as well as transitional housing if eligible.  [facility] [address] [place], [place], [address] [phone] phone [phone] fax  [facility] [phone] [address] [place], [place], [address] [phone] phone [phone] fax  HALFWAY HOUSES:  [facility] [phone]  [employer].oxfordvacancies.com  12 STEP PROGRAMS:  Alcoholics Anonymous of [place] SoftwareChalet.be  Narcotics Anonymous of [place] Hit[employer]ct.dk  Al-Anon of [place], [place] www.[place]alanon.org/find-meetings.html  Nar-Anon  https://nar-anon.org/find-a-meetin  List of Residential placements:   [facility] in [place]: [phone]  [facility]: [phone]  [place], [place] [phone]: Female and female facility; 30-day program: (uninsured and Medicaid such as Laurena Bering, [place], [place], partners)  [facility]: [phone]; men and women's facility; 28 days; Can have Medicaid tailored plan Tour manager or Partners)  [facility]: [phone] Karoline Caldwell or Larita Fife; 28 day program; must be fully detox; tailored Medicaid or no insurance  [address] in [place], [place]; [phone]; 28 day all males program; no insurance accepted  [facility] Referral in [place]: Gabriel Rung [phone] (no insurance or Medicaid only); 90 days; outpatient services but provide housing in apartments [place]  [facility] Admission: [phone]: Patient must complete phone screening for placement: [place], ; 6 month program; uninsured, Medicaid, and [employer].   [facility]: no insurance required; [phone]  [facility]: [phone]; Intake: Molly Maduro; Must fill out application online; Alecia Lemming Delay [phone] x [address] [facility] in [place], [place]: [phone]; Admissions Coordinators Mr. Maurine Minister or Barron Alvine; 90 day program.  [facility]: [place], [place] [phone]; Co-Ed 9 month to a year program; Online application; Men entry fee is $500 (6-10months);  [employer]: [address] [place], [place] [address]; no fee or insurance required; minimum of 2 years; Highly structured; work based; Intake Coordinator is Thayer Ohm [phone]  [facility] in [place], [place]: [phone]; Fax number is [phone]; website: www.[facility].org; Requires 3-6 page autobiography; 2 year program (18 months and then 2month transitional housing); Admission fee is $300; no insurance needed; work  Automotive engineer in [place], [place]: [employer] Desk Staff: Danise Edge [phone]: They have a Men's Regenerations Program 6-14months. Free program; There is an initial $300 fee however, they are willing to  work with patients regarding that. Application is online.  [facility]: Admissions [phone] Doran Heater ext [phone]; Any 7-90 day program is out of pocket; 12 month program is free of charge; there is a $275 entry fee; Patient is responsible for own transportation
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
## 2023-02-05 NOTE — ED Notes (Signed)
Pt is in the dayroom watching TV with peers. Pt denies SI/HI/AVH. No acute distress noted. Will continue to monitor for safety. 

## 2023-02-06 ENCOUNTER — Encounter (HOSPITAL_COMMUNITY): Payer: Self-pay | Admitting: Student in an Organized Health Care Education/Training Program

## 2023-02-06 LAB — HCV RNA DIAGNOSIS, NAA
HCV RNA, Quantitation: 119000 [IU]/mL
HCV RNA, log10: 5.076 {Log}

## 2023-02-06 LAB — TSH: TSH: 0.627 u[IU]/mL (ref 0.350–4.500)

## 2023-02-06 LAB — T4, FREE: Free T4: 1.17 ng/dL — ABNORMAL HIGH (ref 0.61–1.12)
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
## 2023-02-06 NOTE — ED Notes (Signed)
Patient is sleeping. Respirations equal and unlabored, skin warm and dry. No change in assessment or acuity. Routine safety checks conducted according to facility protocol. Will continue to monitor for safety.   

## 2023-02-07 LAB — T3, FREE: T3, Free: 2.9 pg/mL (ref 2.0–4.4)

## 2023-02-09 ENCOUNTER — Encounter: Payer: Self-pay | Admitting: Student in an Organized Health Care Education/Training Program

## 2023-02-17 ENCOUNTER — Encounter (HOSPITAL_COMMUNITY): Payer: Self-pay | Admitting: Registered Nurse

## 2023-02-17 ENCOUNTER — Ambulatory Visit (HOSPITAL_COMMUNITY)
Admission: EM | Admit: 2023-02-17 | Discharge: 2023-02-17 | Disposition: A | Discharge status: Home / Self Care | Payer: Medicaid Other | Attending: Psychiatry | Admitting: Psychiatry

## 2023-02-17 DIAGNOSIS — F1510 Other stimulant abuse, uncomplicated: Secondary | ICD-10-CM

## 2023-02-17 DIAGNOSIS — F1590 Other stimulant use, unspecified, uncomplicated: Secondary | ICD-10-CM | POA: Insufficient documentation

## 2023-02-17 DIAGNOSIS — F1290 Cannabis use, unspecified, uncomplicated: Secondary | ICD-10-CM | POA: Insufficient documentation

## 2023-02-17 DIAGNOSIS — F17200 Nicotine dependence, unspecified, uncomplicated: Secondary | ICD-10-CM | POA: Insufficient documentation

## 2023-02-17 DIAGNOSIS — Z5900 Homelessness unspecified: Secondary | ICD-10-CM | POA: Insufficient documentation

## 2023-02-17 NOTE — Discharge Instructions (Signed)
F/u with out patient services

## 2023-02-17 NOTE — ED Provider Notes (Signed)
Behavioral Health Urgent Care Medical Screening Exam  [name] MRN: [id number] Date of Evaluation: 02/17/23 Chief Complaint:  [facility] Diagnosis:  Final diagnoses:  Homelessness  Marijuana use  Methamphetamine use (HCC)    History of Present illness: Allison Woodard is a 32 y.o. female. With a history of meth abuse, marijuana use, presented to [facility], voluntarily.  Per the patient I [facility] when I was discharged from here 2 weeks ago and they did not admit me they stated that I was taking Keppra medicine so they would not take me.  When asked when did she go to day [facility] patient stated that they when she left here which would have been 02/05/2023.  According to patient she was staying with her mother but she left 2 days ago.  When asked why she cannot return to her mother's house patient could not answer.  Patient does appear to be seeking secondary gain because of homelessness.   According to patient she finished taking her Keppra when asked why was she on Keppra if she has seizures patient stated no she had had a trauma and they had given her Keppra while she was in the hospital.  Discussed with patient that she need to follow up with her outpatient provider such as her PCP and follow the instructions that was given to her at discharge to see if she needs to continue her medicine.   Face-to-face evaluation of patient, patient is alert and oriented x 4, speech is clear, maintaining minimal to no eye contact.  Patient denies SI, HI, AVH or paranoia.  According to the patient she last smoked methamphetamines yesterday and she smoked marijuana daily.  According to the patient she is currently homeless.  Patient does not seem to be a risk to herself or others, denies access to guns.  Patient is unemployed currently.  Writer discussed with patient that she need to reach out to the woman's shelter to see if she can get a bed there. Patient was also educated that  she should follow up with the discharge information given to her at discharge and also she need to reach back out to [facility] tomorrow to let them know she is finished taking her Keppra.   Patient was advised to call 911 should she encounter any suicidal ideation homicidal ideation or hallucination.  Patient verbalized understanding.  Recommend discharge for patient to follow-up with discharge instructions given  Flowsheet Row ED from 02/17/2023 in [facility] ED from 02/04/2023 in [facility] at [facility] ED from 02/03/2023 in [facility]  C-SSRS RISK CATEGORY No Risk No Risk No Risk       Psychiatric Specialty Exam  Presentation  General Appearance:Appropriate for Environment  Eye Contact:Fair  Speech:Clear and Coherent  Speech Volume:Normal  Handedness:Right   Mood and Affect  Mood: Anxious  Affect: Congruent   Thought Process  Thought Processes: Linear  Descriptions of Associations:Intact  Orientation:Full (Time, Place and Person)  Thought Content:WDL  Diagnosis of Schizophrenia or Schizoaffective disorder in past: No   Hallucinations:None  Ideas of Reference:None  Suicidal Thoughts:No  Homicidal Thoughts:No   Sensorium  Memory: Immediate Fair  Judgment: Fair  Insight: Fair   Art therapist  Concentration: Fair  Attention Span: Fair  Recall: [employer] of Knowledge: Fair  Language: Fair   Psychomotor Activity  Psychomotor Activity: Normal   Assets  Assets: Desire for Improvement; Housing  Sleep  Sleep: Fair  Number of hours:  6   Physical Exam: Physical Exam HENT:     Head: Normocephalic.     Nose: Nose normal.  Eyes:     Pupils: Pupils are equal, round, and reactive to light.  Cardiovascular:     Rate and Rhythm: Normal rate.  Pulmonary:     Effort: Pulmonary effort is normal.  Musculoskeletal:        General:  Normal range of motion.     Cervical back: Normal range of motion.  Skin:    General: Skin is warm.  Neurological:     General: No focal deficit present.     Mental Status: She is alert.  Psychiatric:        Mood and Affect: Mood normal.        Behavior: Behavior normal.   Review of Systems  Constitutional: Negative.   HENT: Negative.    Eyes: Negative.   Respiratory: Negative.    Cardiovascular: Negative.   Gastrointestinal: Negative.   Genitourinary: Negative.   Musculoskeletal: Negative.   Skin: Negative.   Neurological: Negative.   Psychiatric/Behavioral:  Positive for substance abuse. The patient is nervous/anxious.    Blood pressure (!) 150/110, pulse (!) 119, temperature 98.8 F (37.1 C), temperature source Oral, SpO2 99%. There is no height or weight on file to calculate BMI.  Musculoskeletal: Strength & Muscle Tone: within normal limits Gait & Station: normal Patient leans: N/A   [facility] MSE Discharge Disposition for Follow up and Recommendations: Based on my evaluation the patient does not appear to have an emergency medical condition and can be discharged with resources and follow up care in outpatient services for Substance Abuse Intensive Outpatient Program and Individual Therapy   Sindy Guadeloupe, NP 02/17/2023, 8:37 PM

## 2023-02-17 NOTE — Progress Notes (Signed)
   02/17/23 1836  [facility] Triage Screening (Walk-ins at [facility] only)  What Is the Reason for Your Visit/Call Today? Pt presents to [facility] voluntarily unaccompanied. Pt states that she was just here a few weeks ago and was suppose to go to [facility]. Pt states that [facility] wouldn't take her because of a medication (Keppra) that she had been prescribed.  How Long Has This Been Causing You Problems? 1 wk - 1 month  Have You Recently Had Any Thoughts About Hurting Yourself? No  Are You Planning to Commit Suicide/Harm Yourself At This time? No  Have you Recently Had Thoughts About Hurting Someone [name]? No  Are You Planning To Harm Someone At This Time? No  Are you currently experiencing any auditory, visual or other hallucinations? No  Have You Used Any Alcohol or Drugs in the Past 24 Hours? No  Do you have any current medical co-morbidities that require immediate attention? No  Clinician description of patient physical appearance/behavior: shaking, casually dressed  What Do You Feel Would Help You the Most Today? Social Support;Medication(s)  If access to [facility] was not available, would you have sought care in the Emergency Department? No  Determination of Need Routine (7 days)  Options For Referral Facility-Based Crisis;Medication Management;Outpatient Therapy

## 2023-02-19 ENCOUNTER — Ambulatory Visit (HOSPITAL_COMMUNITY)
Admission: EM | Admit: 2023-02-19 | Discharge: 2023-02-19 | Disposition: A | Discharge status: Home / Self Care | Payer: Medicaid Other

## 2023-02-19 NOTE — Progress Notes (Signed)
Pt left facility AMA  

## 2023-02-22 ENCOUNTER — Other Ambulatory Visit: Payer: Self-pay

## 2023-02-22 ENCOUNTER — Emergency Department (HOSPITAL_COMMUNITY)
Admission: EM | Admit: 2023-02-22 | Discharge: 2023-02-22 | Disposition: A | Discharge status: Home / Self Care | Payer: Medicaid Other | Attending: Emergency Medicine | Admitting: Emergency Medicine

## 2023-02-22 ENCOUNTER — Encounter (HOSPITAL_COMMUNITY): Payer: Self-pay | Admitting: Emergency Medicine

## 2023-02-22 DIAGNOSIS — Z7982 Long term (current) use of aspirin: Secondary | ICD-10-CM | POA: Insufficient documentation

## 2023-02-22 DIAGNOSIS — D696 Thrombocytopenia, unspecified: Secondary | ICD-10-CM | POA: Diagnosis not present

## 2023-02-22 DIAGNOSIS — E876 Hypokalemia: Secondary | ICD-10-CM | POA: Insufficient documentation

## 2023-02-22 DIAGNOSIS — R531 Weakness: Secondary | ICD-10-CM

## 2023-02-22 DIAGNOSIS — R1084 Generalized abdominal pain: Secondary | ICD-10-CM | POA: Insufficient documentation

## 2023-02-22 LAB — CBC WITH DIFFERENTIAL/PLATELET
Abs Immature Granulocytes: 0.04 10*3/uL (ref 0.00–0.07)
Basophils Absolute: 0.1 10*3/uL (ref 0.0–0.1)
Basophils Relative: 1 %
Eosinophils Absolute: 0 10*3/uL (ref 0.0–0.5)
Eosinophils Relative: 0 %
HCT: 34.8 % — ABNORMAL LOW (ref 36.0–46.0)
Hemoglobin: 11.2 g/dL — ABNORMAL LOW (ref 12.0–15.0)
Immature Granulocytes: 0 %
Lymphocytes Relative: 28 %
Lymphs Abs: 3.2 10*3/uL (ref 0.7–4.0)
MCH: 27.3 pg (ref 26.0–34.0)
MCHC: 32.2 g/dL (ref 30.0–36.0)
MCV: 84.9 fL (ref 80.0–100.0)
Monocytes Absolute: 0.9 10*3/uL (ref 0.1–1.0)
Monocytes Relative: 8 %
Neutro Abs: 7.3 10*3/uL (ref 1.7–7.7)
Neutrophils Relative %: 63 %
Platelets: 542 10*3/uL — ABNORMAL HIGH (ref 150–400)
RBC: 4.1 MIL/uL (ref 3.87–5.11)
RDW: 14.3 % (ref 11.5–15.5)
WBC: 11.5 10*3/uL — ABNORMAL HIGH (ref 4.0–10.5)
nRBC: 0 % (ref 0.0–0.2)

## 2023-02-22 LAB — HCG, SERUM, QUALITATIVE: Preg, Serum: NEGATIVE

## 2023-02-22 LAB — BASIC METABOLIC PANEL
Anion gap: 10 (ref 5–15)
BUN: 9 mg/dL (ref 6–20)
CO2: 25 mmol/L (ref 22–32)
Calcium: 9.4 mg/dL (ref 8.9–10.3)
Chloride: 105 mmol/L (ref 98–111)
Creatinine, Ser: 0.72 mg/dL (ref 0.44–1.00)
GFR, Estimated: >60 mL/min (ref >60)
Glucose, Bld: 98 mg/dL (ref 70–99)
Potassium: 3.1 mmol/L — ABNORMAL LOW (ref 3.5–5.1)
Sodium: 140 mmol/L (ref 135–145)

## 2023-02-22 LAB — CBG MONITORING, ED: Glucose-Capillary: 113 mg/dL — ABNORMAL HIGH (ref 70–99)

## 2023-02-22 MED ORDER — POTASSIUM CHLORIDE CRYS ER 20 MEQ PO TBCR
40.0000 meq | EXTENDED_RELEASE_TABLET | Freq: Once | ORAL | Status: AC
  Administered 2023-02-22: 40 meq via ORAL
  Filled 2023-02-22: qty 2

## 2023-02-22 NOTE — Discharge Instructions (Addendum)
You are seen in the emergency department for general weakness.  Your platelets were still elevated although better than prior.  Your potassium was mildly low.  Please follow-up with the blood doctor hematology as scheduled.  Return to the emergency department if any worsening or concerning symptoms.

## 2023-02-22 NOTE — ED Triage Notes (Signed)
Pt arrives via POV from home with anxiety because had blood work that showed she had elevated platelets. Was told to followup with hematology. Appears extremely anxious.

## 2023-02-22 NOTE — ED Provider Notes (Signed)
[place] EMERGENCY DEPARTMENT AT [facility] Provider Note   CSN: [id number] Arrival date & time: 02/22/23  1250     History  Chief Complaint  Patient presents with   Weakness   Anxiety    Allison Woodard is a 32 y.o. female.  She is here with a complaint of feeling generally weak.  She cannot tell me when it started.  She said she has not had much of an appetite since September.  She is very concerned because she was told she has high platelets and that it could be related to cancer.  She denies any chest pain or shortness of breath no abdominal pain vomiting or diarrhea.  No headache blurry vision double vision numbness or focal weakness.  She has an appointment with hematology coming up in a week or so.  She was taking aspirin and Keppra but has finished those and does not have anymore.  She said she feels very anxious due to this new diagnosis and has been seen by mental health recently  The history is provided by the patient.  Weakness Severity:  Unable to specify Onset quality:  Unable to specify Timing:  Constant Progression:  Unchanged Associated symptoms: no abdominal pain, no chest pain, no cough, no difficulty walking, no fever, no nausea, no shortness of breath and no vomiting   Anxiety Pertinent negatives include no chest pain, no abdominal pain and no shortness of breath.       Home Medications Prior to Admission medications   Medication Sig Start Date End Date Taking? Authorizing Provider  aspirin EC 81 MG tablet Take 1 tablet (81 mg total) by mouth daily. Swallow whole. 02/06/23   Carrion-Carrero, Karle Starch, MD  levETIRAcetam (KEPPRA) 500 MG tablet Take 1 tablet (500 mg total) by mouth 2 (two) times daily. 02/05/23 03/07/23  Carrion-Carrero, Karle Starch, MD  Multiple Vitamin (MULTIVITAMIN WITH MINERALS) TABS tablet Take 1 tablet by mouth daily. 02/06/23   Carrion-Carrero, Karle Starch, MD  nicotine (NICODERM CQ - DOSED IN MG/24 HR) 7 mg/24hr patch Place 1 patch (7  mg total) onto the skin daily as needed (NRT). 02/05/23   Carrion-Carrero, Karle Starch, MD  thiamine (VITAMIN B-1) 100 MG tablet Take 1 tablet (100 mg total) by mouth daily. 02/06/23   Carrion-Carrero, Karle Starch, MD      Allergies    Patient has no known allergies.    Review of Systems   Review of Systems  Constitutional:  Negative for fever.  Eyes:  Negative for visual disturbance.  Respiratory:  Negative for cough and shortness of breath.   Cardiovascular:  Negative for chest pain.  Gastrointestinal:  Negative for abdominal pain, nausea and vomiting.  Neurological:  Positive for weakness.    Physical Exam Updated Vital Signs BP (!) 140/85 (BP Location: Right Arm)   Pulse (!) 132   Temp 97.8 F (36.6 C)   Resp 16   Ht 5\' 2"  (1.575 m)   Wt 50.3 kg   SpO2 100%   BMI 20.30 kg/m  Physical Exam Vitals and nursing note reviewed.  Constitutional:      General: She is not in acute distress.    Appearance: Normal appearance. She is well-developed.  HENT:     Head: Normocephalic and atraumatic.  Eyes:     Conjunctiva/sclera: Conjunctivae normal.  Cardiovascular:     Rate and Rhythm: Regular rhythm. Tachycardia present.     Heart sounds: No murmur heard. Pulmonary:     Effort: Pulmonary effort is normal. No respiratory distress.  Breath sounds: Normal breath sounds.  Abdominal:     Palpations: Abdomen is soft.     Tenderness: There is no abdominal tenderness. There is no guarding or rebound.  Musculoskeletal:        General: No deformity. Normal range of motion.     Cervical back: Neck supple.  Skin:    General: Skin is warm and dry.     Capillary Refill: Capillary refill takes less than 2 seconds.  Neurological:     General: No focal deficit present.     Mental Status: She is alert and oriented to person, place, and time.     Cranial Nerves: No cranial nerve deficit.     Sensory: No sensory deficit.     Motor: No weakness.     ED Results / Procedures / Treatments    Labs (all labs ordered are listed, but only abnormal results are displayed) Labs Reviewed  CBC WITH DIFFERENTIAL/PLATELET - Abnormal; Notable for the following components:      Result Value   WBC 11.5 (*)    Hemoglobin 11.2 (*)    HCT 34.8 (*)    Platelets 542 (*)    All other components within normal limits  BASIC METABOLIC PANEL - Abnormal; Notable for the following components:   Potassium 3.1 (*)    All other components within normal limits  CBG MONITORING, ED - Abnormal; Notable for the following components:   Glucose-Capillary 113 (*)    All other components within normal limits  HCG, SERUM, QUALITATIVE  URINALYSIS, ROUTINE W REFLEX MICROSCOPIC    EKG EKG Interpretation Date/Time:  Saturday February 22 2023 13:46:40 EST Ventricular Rate:  113 PR Interval:  128 QRS Duration:  70 QT Interval:  315 QTC Calculation: 432 R Axis:   41  Text Interpretation: Sinus tachycardia Probable left atrial enlargement Probable anteroseptal infarct, old increased rate from prior 10/24 Confirmed by Meridee Score [phone]) on 02/22/2023 1:47:48 PM  Radiology No results found.  Procedures Procedures    Medications Ordered in ED Medications  potassium chloride SA (KLOR-CON M) CR tablet 40 mEq (40 mEq Oral Given 02/22/23 1511)    ED Course/ Medical Decision Making/ A&P                                 Medical Decision Making Amount and/or Complexity of Data Reviewed Labs: ordered.  Risk Prescription drug management.   This patient complains of general weakness anxiety; this involves an extensive number of treatment Options and is a complaint that carries with it a high risk of complications and morbidity. The differential includes anxiety, dehydration, infection, pregnancy, metabolic derangement  I ordered, reviewed and interpreted labs, which included CBC with mildly elevated white count stable hemoglobin, platelets elevated better than priors, chemistries with low  potassium, pregnancy test negative I ordered medication oral potassium and reviewed PMP when indicated. Previous records obtained and reviewed in epic, multiple ED visits recently  Cardiac monitoring reviewed, sinus tachycardia improving to normal sinus rhythm  Social determinants considered, tobacco use, partner violence, utility need Critical Interventions: None  After the interventions stated above, I reevaluated the patient and found patient's heart rate to be improved and she is feeling symptomatically improved Admission and further testing considered, no indications for admission or further workup at this time.  Recommended she follow-up outpatient with behavioral health and with hematology as scheduled.  Return instructions discussed  Final Clinical Impression(s) / ED Diagnoses Final diagnoses:  Generalized weakness  Hypokalemia  Thrombocytopenia (HCC)    Rx / DC Orders ED Discharge Orders     None         Terrilee Files, MD 02/22/23 1746

## 2023-03-04 ENCOUNTER — Encounter: Payer: Self-pay | Admitting: Nurse Practitioner

## 2023-03-04 ENCOUNTER — Inpatient Hospital Stay: Payer: Medicaid Other | Admitting: Licensed Clinical Social Worker

## 2023-03-04 ENCOUNTER — Inpatient Hospital Stay: Payer: Medicaid Other | Attending: Nurse Practitioner | Admitting: Nurse Practitioner

## 2023-03-04 ENCOUNTER — Inpatient Hospital Stay: Payer: Medicaid Other | Admitting: Nurse Practitioner

## 2023-03-04 VITALS — BP 100/60 | HR 102 | Temp 98.3°F | Resp 17 | Wt 119.2 lb

## 2023-03-04 DIAGNOSIS — D75839 Thrombocytosis, unspecified: Secondary | ICD-10-CM

## 2023-03-04 DIAGNOSIS — D500 Iron deficiency anemia secondary to blood loss (chronic): Secondary | ICD-10-CM | POA: Diagnosis not present

## 2023-03-04 DIAGNOSIS — D75838 Other thrombocytosis: Secondary | ICD-10-CM | POA: Diagnosis present

## 2023-03-04 DIAGNOSIS — B1920 Unspecified viral hepatitis C without hepatic coma: Secondary | ICD-10-CM | POA: Insufficient documentation

## 2023-03-04 LAB — SAVE SMEAR(SSMR), FOR PROVIDER SLIDE REVIEW

## 2023-03-04 LAB — CBC (CANCER CENTER ONLY)
HCT: 36.8 % (ref 36.0–46.0)
Hemoglobin: 11.7 g/dL — ABNORMAL LOW (ref 12.0–15.0)
MCH: 26.9 pg (ref 26.0–34.0)
MCHC: 31.8 g/dL (ref 30.0–36.0)
MCV: 84.6 fL (ref 80.0–100.0)
Platelet Count: 471 10*3/uL — ABNORMAL HIGH (ref 150–400)
RBC: 4.35 MIL/uL (ref 3.87–5.11)
RDW: 14.6 % (ref 11.5–15.5)
WBC Count: 12 10*3/uL — ABNORMAL HIGH (ref 4.0–10.5)
nRBC: 0 % (ref 0.0–0.2)

## 2023-03-04 LAB — CMP (CANCER CENTER ONLY)
ALT: 62 U/L — ABNORMAL HIGH (ref 0–44)
AST: 58 U/L — ABNORMAL HIGH (ref 15–41)
Albumin: 3.3 g/dL — ABNORMAL LOW (ref 3.5–5.0)
Alkaline Phosphatase: 103 U/L (ref 38–126)
Anion gap: 4 — ABNORMAL LOW (ref 5–15)
BUN: 17 mg/dL (ref 6–20)
CO2: 26 mmol/L (ref 22–32)
Calcium: 9.1 mg/dL (ref 8.9–10.3)
Chloride: 104 mmol/L (ref 98–111)
Creatinine: 0.48 mg/dL (ref 0.44–1.00)
GFR, Estimated: >60 mL/min (ref >60)
Glucose, Bld: 109 mg/dL — ABNORMAL HIGH (ref 70–99)
Potassium: 4.1 mmol/L (ref 3.5–5.1)
Sodium: 134 mmol/L — ABNORMAL LOW (ref 135–145)
Total Bilirubin: 0.3 mg/dL (ref <1.2)
Total Protein: 8.1 g/dL (ref 6.5–8.1)

## 2023-03-04 LAB — IRON AND IRON BINDING CAPACITY (CC-WL,HP ONLY)
Iron: 17 ug/dL — ABNORMAL LOW (ref 28–170)
Saturation Ratios: 3 % — ABNORMAL LOW (ref 10.4–31.8)
TIBC: 567 ug/dL — ABNORMAL HIGH (ref 250–450)
UIBC: 550 ug/dL — ABNORMAL HIGH (ref 148–442)

## 2023-03-04 LAB — WBC/PLT IN CITRATE

## 2023-03-04 LAB — VITAMIN B12: Vitamin B-12: 421 pg/mL (ref 180–914)

## 2023-03-04 MED ORDER — FERROUS SULFATE 325 (65 FE) MG PO TBEC: 325.0000 mg | DELAYED_RELEASE_TABLET | Freq: Every day | ORAL | 3 refills | Status: DC

## 2023-03-04 NOTE — Assessment & Plan Note (Addendum)
01/20/2023 - hospitalized for TBI. Had severely elevated platelet count, as high as 926. She also had mild leukocytosis and mild iron deficiency anemia. -Several subsequent ED visits indicate persistent elevation of platelets, however gradually improving. -Discussed several common causes of thrombocytosis including essential thrombocytosis, thrombocytosis secondary to iron deficiency anemia, myeloproliferative diseases, and less commonly, cancer related thrombocytosis.  -Check labs today to further evaluate thrombocytosis and iron deficiency anemia.  Will contact patient with results.  Plan to recheck labs in 1 month and in 3 months.  Will follow-up in 6 months with additional lab check.

## 2023-03-04 NOTE — Progress Notes (Signed)
[facility] CSW Progress Note  Visual merchandiser met with patient and brother to introduce self and provide pt w/ contact information for CSW.  Pt confirmed she has stable housing w/ no immediate concerns at this time.  Pt made aware CSW is available to answer questions and assist as needed.      Rachel Moulds, LCSW Clinical Social Worker  Cancer Center    Patient is participating in a Managed Medicaid Plan:  Yes
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
## 2023-03-04 NOTE — Assessment & Plan Note (Addendum)
Discussed with her that often thrombocytosis is secondary to iron deficiency anemia.  Majority of the time this is cause for otherwise healthy young women. Start prescription for oral iron daily.  Recommend she take with orange juice or vitamin C to help with absorption. Labs today to further assist thrombocytosis and iron deficiency anemia.  Will contact patient with results.  Plan to follow-up with recheck labs in 1 month and 3 months.  Lab check in 6 months with follow-up visit.

## 2023-03-04 NOTE — Assessment & Plan Note (Addendum)
Patient admitted to rehab facility 02/03/2023.  Screening for hepatitis and HIV yielded positive hepatitis C.  HCV RNA viral load was 119,000, indicating acute hepatitis C infection.  Refer to infectious disease for treatment.

## 2023-03-05 LAB — FOLATE RBC
Folate, Hemolysate: 329 ng/mL
Folate, RBC: 899 ng/mL (ref >498)
Hematocrit: 36.6 % (ref 34.0–46.6)

## 2023-03-05 LAB — FERRITIN: Ferritin: 10 ng/mL — ABNORMAL LOW (ref 11–307)

## 2023-03-05 NOTE — Progress Notes (Signed)
Hi John. Will you let the patient know that her platelet count is improving. She is pretty iron deficient. I would like to get her set up for IV iron if she is OK with that.  Thank you.  Herbert Seta, NP

## 2023-03-15 ENCOUNTER — Ambulatory Visit (HOSPITAL_COMMUNITY)
Admission: EM | Admit: 2023-03-15 | Discharge: 2023-03-15 | Disposition: A | Discharge status: Home / Self Care | Payer: Medicaid Other | Attending: Psychiatry | Admitting: Psychiatry

## 2023-03-15 DIAGNOSIS — Z5902 Unsheltered homelessness: Secondary | ICD-10-CM | POA: Insufficient documentation

## 2023-03-15 DIAGNOSIS — F1910 Other psychoactive substance abuse, uncomplicated: Secondary | ICD-10-CM | POA: Insufficient documentation

## 2023-03-15 NOTE — Discharge Instructions (Signed)

## 2023-03-15 NOTE — Discharge Summary (Signed)
Allison Woodard to be D/C'd Home per NP order. An After Visit Summary was printed and given to the patient by provider. Patient escorted out, and D/C home via private auto.  Dickie La  03/15/2023 4:19 PM
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
## 2023-03-15 NOTE — ED Provider Notes (Signed)
Behavioral Health Urgent Care Medical Screening Exam  [name] MRN: [id number] Date of Evaluation: 03/15/23 Chief Complaint:   Diagnosis:  Final diagnoses:  Polysubstance abuse (HCC)    History of Present illness: Allison Woodard is a 32 y.o. female.   Patient presents to [facility] voluntarily, accompanied by her brother who is her primary support. Patient  reports that she is looking for a detox from meth. Patient reports that she  was turned away from [facility] due to taking her prescribed Kepra medication. Patient mentions she has been off this medication for 3 weeks. Patient  reports she last used "not alot" of meth 2 days ago.She reports she has a hx of using meth for 4 years. She reports that she  is looking for a long term  program to help with her ongoing addiction. Pt denies SI, HI. AVH. Patient reports she is currently homeless and has been living on the street. She reports that her mother kicked her out of the house due to her substance abuse problem. Her mother is willing to have her back home once clean.   Per patient's brother Shanna Cisco [phone]: Allison Woodard has been living on the street after she was kicked out of the home by mother due to substance use. Patient  has a son who lives with her mother. Patient minimizes her substance abuse and denies heroin use but there are videos and other records indicating that she uses. Patient uses meth on daily basis. She also drinks alcohol occasionally. Patient was supposed to be admitted to a residential facility at [facility] upon discharge from [facility]. However, she was declined because she was taking [place] that time. She no longer takes [place] because it was for a short term use. Her mom is willing to have her back home once clean and sober.   Assessment: Patient is evaluated face-to-face by this provider and chart reviewed. She allows her brother to participate in the assessment as her primary support. Allison Woodard is a 32 year-old  female who is sitting in the assessment room, anxious, restless, disheveled. She appears to be worried and helpless. Alert and oriented x 4. Her thought process is coherent and goal-directed. She denies SI/HI/AVH. She does not seem to be preoccupied or responding to internal stimuli. Her eye contact is fair. Patient remains focused on her goal throughout this evaluation. She admits to suffering from addiction and her drug of choice is Meth. Last use was 2 days ago. She reports that she also smokes Marijuana. She reports no recent use of Heroin although her brother reports that she is not telling the truth. Patient reports that she also drinks alcohol occasionally. She reports that she was treated at [facility] in 01/2023 and was supposed to transfer to [facility]. She was denied due to taking Kepra at that time. Patient reports that she no longer takes Sheralyn Boatman because it was for a short term use. Patient went home and started using drugs again.  She reports no current outpatient services and no psychiatric medications.  She reports no medical issues. Reports that her appetite is good and sleep is fair. Patient is homeless, unemployed. She has an 14 year-old son who lives with her mother. Brother is supportive.   Patient expresses motivation for a residential treatment program. I contacted [facility] in [place] and was informed that patient can walk in for evaluation and admission. Patient is taken to [facility] by her brother for evaluation. She denies SI/HI/AVH. She expresses motivation for self-improvement.  Flowsheet Row ED from 03/15/2023 in [facility] ED from 02/22/2023 in [facility] Emergency Department at [facility] ED from 02/17/2023 in [facility]  C-SSRS RISK CATEGORY Error: Q3, 4, or 5 should not be populated when Q2 is No No Risk No Risk       Psychiatric Specialty Exam  Presentation  General Appearance:Casual  Eye  Contact:Fair  Speech:Clear and Coherent  Speech Volume:Normal  Handedness:Right   Mood and Affect  Mood: Anxious  Affect: Congruent   Thought Process  Thought Processes: Coherent; Goal Directed  Descriptions of Associations:Intact  Orientation:Full (Time, Place and Person)  Thought Content:WDL  Diagnosis of Schizophrenia or Schizoaffective disorder in past: No   Hallucinations:None  Ideas of Reference:None  Suicidal Thoughts:No  Homicidal Thoughts:No   Sensorium  Memory: Immediate Fair; Recent Fair; Remote Fair  Judgment: Fair  Insight: Fair   Art therapist  Concentration: Fair  Attention Span: Fair  Recall: [employer] of Knowledge: Fair  Language: Fair   Psychomotor Activity  Psychomotor Activity: Restlessness   Assets  Assets: Manufacturing systems engineer; Desire for Improvement; Physical Health   Sleep  Sleep: Fair  Number of hours:  7   Physical Exam: Physical Exam Vitals and nursing note reviewed.  Constitutional:      Appearance: Normal appearance.  HENT:     Head: Normocephalic and atraumatic.     Right Ear: Tympanic membrane normal.     Left Ear: Tympanic membrane normal.     Nose: Nose normal.  Eyes:     Extraocular Movements: Extraocular movements intact.     Pupils: Pupils are equal, round, and reactive to light.  Cardiovascular:     Rate and Rhythm: Normal rate.     Pulses: Normal pulses.  Pulmonary:     Effort: Pulmonary effort is normal.  Musculoskeletal:        General: Normal range of motion.     Cervical back: Normal range of motion and neck supple.  Neurological:     General: No focal deficit present.     Mental Status: She is alert and oriented to person, place, and time.    Review of Systems  Constitutional: Negative.   HENT: Negative.    Eyes: Negative.   Respiratory: Negative.    Cardiovascular: Negative.   Gastrointestinal: Negative.   Genitourinary: Negative.   Musculoskeletal:  Negative.   Skin: Negative.   Neurological: Negative.   Endo/Heme/Allergies: Negative.   Psychiatric/Behavioral: Negative.     Blood pressure (!) 139/97, pulse 100, temperature 98.9 F (37.2 C), temperature source Oral, resp. rate 20, SpO2 100%. There is no height or weight on file to calculate BMI.  Musculoskeletal: Strength & Muscle Tone: within normal limits Gait & Station: normal Patient leans: N/A   [facility] MSE Discharge Disposition for Follow up and Recommendations: Based on my evaluation the patient does not appear to have an emergency medical condition and can be discharged with resources and follow up care in outpatient services for Medication Management, Substance Abuse Intensive Outpatient Program, Individual Therapy, and Group Therapy   Olin Pia, NP 03/15/2023, 4:08 PM

## 2023-03-17 ENCOUNTER — Ambulatory Visit (HOSPITAL_COMMUNITY)
Admission: EM | Admit: 2023-03-17 | Discharge: 2023-03-18 | Disposition: A | Discharge status: Home / Self Care | Payer: Medicaid Other | Attending: Addiction Medicine | Admitting: Addiction Medicine

## 2023-03-17 ENCOUNTER — Other Ambulatory Visit: Payer: Self-pay

## 2023-03-17 DIAGNOSIS — F1590 Other stimulant use, unspecified, uncomplicated: Secondary | ICD-10-CM

## 2023-03-17 DIAGNOSIS — D509 Iron deficiency anemia, unspecified: Secondary | ICD-10-CM | POA: Insufficient documentation

## 2023-03-17 DIAGNOSIS — F17210 Nicotine dependence, cigarettes, uncomplicated: Secondary | ICD-10-CM | POA: Insufficient documentation

## 2023-03-17 DIAGNOSIS — Z5900 Homelessness unspecified: Secondary | ICD-10-CM | POA: Insufficient documentation

## 2023-03-17 DIAGNOSIS — R9431 Abnormal electrocardiogram [ECG] [EKG]: Secondary | ICD-10-CM | POA: Insufficient documentation

## 2023-03-17 DIAGNOSIS — F1210 Cannabis abuse, uncomplicated: Secondary | ICD-10-CM | POA: Insufficient documentation

## 2023-03-17 DIAGNOSIS — F1510 Other stimulant abuse, uncomplicated: Secondary | ICD-10-CM | POA: Insufficient documentation

## 2023-03-17 LAB — COMPREHENSIVE METABOLIC PANEL
ALT: 34 U/L (ref 0–44)
AST: 39 U/L (ref 15–41)
Albumin: 3.5 g/dL (ref 3.5–5.0)
Alkaline Phosphatase: 77 U/L (ref 38–126)
Anion gap: 11 (ref 5–15)
BUN: 11 mg/dL (ref 6–20)
CO2: 23 mmol/L (ref 22–32)
Calcium: 9.4 mg/dL (ref 8.9–10.3)
Chloride: 103 mmol/L (ref 98–111)
Creatinine, Ser: 0.64 mg/dL (ref 0.44–1.00)
GFR, Estimated: >60 mL/min (ref >60)
Glucose, Bld: 89 mg/dL (ref 70–99)
Potassium: 4.5 mmol/L (ref 3.5–5.1)
Sodium: 137 mmol/L (ref 135–145)
Total Bilirubin: 0.4 mg/dL (ref <1.2)
Total Protein: 7.9 g/dL (ref 6.5–8.1)

## 2023-03-17 LAB — POCT URINE DRUG SCREEN - MANUAL ENTRY (I-SCREEN)
POC Amphetamine UR: NOT DETECTED
POC Buprenorphine (BUP): NOT DETECTED
POC Cocaine UR: NOT DETECTED
POC Marijuana UR: POSITIVE — AB
POC Methadone UR: NOT DETECTED
POC Methamphetamine UR: NOT DETECTED
POC Morphine: NOT DETECTED
POC Oxazepam (BZO): NOT DETECTED
POC Oxycodone UR: NOT DETECTED
POC Secobarbital (BAR): NOT DETECTED

## 2023-03-17 LAB — CBC
HCT: 36.2 % (ref 36.0–46.0)
Hemoglobin: 11.2 g/dL — ABNORMAL LOW (ref 12.0–15.0)
MCH: 25.5 pg — ABNORMAL LOW (ref 26.0–34.0)
MCHC: 30.9 g/dL (ref 30.0–36.0)
MCV: 82.3 fL (ref 80.0–100.0)
Platelets: 636 10*3/uL — ABNORMAL HIGH (ref 150–400)
RBC: 4.4 MIL/uL (ref 3.87–5.11)
RDW: 14.8 % (ref 11.5–15.5)
WBC: 7.9 10*3/uL (ref 4.0–10.5)
nRBC: 0 % (ref 0.0–0.2)

## 2023-03-17 LAB — ETHANOL: Alcohol, Ethyl (B): <10 mg/dL (ref <10)

## 2023-03-17 LAB — TSH: TSH: 0.434 u[IU]/mL (ref 0.350–4.500)

## 2023-03-17 LAB — MAGNESIUM: Magnesium: 2.2 mg/dL (ref 1.7–2.4)

## 2023-03-17 MED ORDER — ACETAMINOPHEN 325 MG PO TABS: 650.0000 mg | ORAL_TABLET | Freq: Four times a day (QID) | ORAL | Status: DC | PRN

## 2023-03-17 MED ORDER — NICOTINE POLACRILEX 2 MG MT GUM
2.0000 mg | CHEWING_GUM | Freq: Once | OROMUCOSAL | Status: AC
  Administered 2023-03-17: 2 mg via ORAL
  Filled 2023-03-17: qty 1

## 2023-03-17 MED ORDER — HYDROXYZINE HCL 25 MG PO TABS
25.0000 mg | ORAL_TABLET | Freq: Three times a day (TID) | ORAL | Status: DC | PRN
  Administered 2023-03-17: 25 mg via ORAL
  Filled 2023-03-17: qty 1

## 2023-03-17 MED ORDER — NICOTINE 21 MG/24HR TD PT24
21.0000 mg | MEDICATED_PATCH | Freq: Every day | TRANSDERMAL | Status: DC
  Filled 2023-03-17: qty 1

## 2023-03-17 MED ORDER — ALUM & MAG HYDROXIDE-SIMETH 200-200-20 MG/5ML PO SUSP: 30.0000 mL | ORAL | Status: DC | PRN

## 2023-03-17 MED ORDER — TRAZODONE HCL 50 MG PO TABS
50.0000 mg | ORAL_TABLET | Freq: Every evening | ORAL | Status: DC | PRN
  Administered 2023-03-17: 50 mg via ORAL
  Filled 2023-03-17: qty 1

## 2023-03-17 MED ORDER — ADULT MULTIVITAMIN W/MINERALS CH
1.0000 | ORAL_TABLET | Freq: Once | ORAL | Status: AC
  Administered 2023-03-17: 1 via ORAL
  Filled 2023-03-17: qty 1

## 2023-03-17 MED ORDER — MAGNESIUM HYDROXIDE 400 MG/5ML PO SUSP: 30.0000 mL | Freq: Every day | ORAL | Status: DC | PRN

## 2023-03-17 NOTE — Progress Notes (Signed)
   03/17/23 1257  [facility] Triage Screening (Walk-ins at [facility] only)  How Did You Hear About [place]? Self  What Is the Reason for Your Visit/Call Today? Allison Woodard is a 32 year old female presenting to [facility] unaccompanied. Pt reports that she is looking to detox off of meth. Pt came from [facility] this morning and said "they could not help me and I need to be medically cleared, and I am homeless". Pt is looking for a 30 day program to help with her ongoing addiction. Pt denies SI, HI and AVH.  How Long Has This Been Causing You Problems? <Week  Have You Recently Had Any Thoughts About Hurting Yourself? No  Are You Planning to Commit Suicide/Harm Yourself At This time? No  Have you Recently Had Thoughts About Hurting Someone [name]? No  Are You Planning To Harm Someone At This Time? No  Physical Abuse Denies  Verbal Abuse Denies  Sexual Abuse Denies  Exploitation of patient/patient's resources Denies  Self-Neglect Denies  Possible abuse reported to: Other (Comment)  Are you currently experiencing any auditory, visual or other hallucinations? No  Have You Used Any Alcohol or Drugs in the Past 24 Hours? No  Do you have any current medical co-morbidities that require immediate attention? No  Clinician description of patient physical appearance/behavior: calm, cooperative  What Do You Feel Would Help You the Most Today? Housing Assistance  If access to [facility] Urgent Care was not available, would you have sought care in the Emergency Department? No  Determination of Need Routine (7 days)  Options For Referral Intensive Outpatient Therapy

## 2023-03-17 NOTE — Progress Notes (Signed)
Pt is admitted to [facility] due to substance abuse and requesting detox. Pt is alert and oriented X3. Pt is ambulatory and is oriented to staff/unit. Pt was cooperative with labs and skin assessment. Pt denies pain and current SI/HI/AVH, plan or intent. Staff will monitor for pt's safety.

## 2023-03-17 NOTE — ED Notes (Signed)
Patient observed/assessed in bed/chair resting quietly appearing in no distress and verbalizing no complaints at this time. Will continue to monitor.  
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
## 2023-03-18 NOTE — Discharge Instructions (Addendum)
Advocacy/Legal Legal Aid [place]:  [phone]  /  [phone]  Family Justice Center:  [phone]  Family Service of the [facility] 24-hr Crisis line:  [phone]  [facility] [place]:  [phone]  Court Watch (custody):  [phone]  [facility]:   [phone]    Baby & Breastfeeding Car Seat Inspection @ Various [place] Equities trader.- call [phone]  [facility] Health Lactation  [phone]  [facility] Regional Lactation [phone]  WIC: [phone] ([place]);  [phone] ([place])  Weed League:  [phone]   Childcare [place] Child Development: [phone] [facility]) / [phone] ([place])  - Child Care Resources/ Referrals/ Scholarships  - Head Start/ Early Head Start (call or apply online)  [place] DHHS: [place] Pre-K :  [phone] / [phone]   Employment / Job Search [employer] of [place]: [phone] / [phone]  [place] [employer] (JobLink): [phone] ([place]) / [phone] ([place])  Triad Engineer, materials Center: [phone] / [phone]  [facility] Job & Career Center: [phone]  DHHS Work First: [phone] ([place]) / [phone] ([place])  StepUp Ministry [place]:  [phone]   Financial Assistance [place] [facility]:  517-766-8321  Salvation Army: (906)887-4561  Dominica Severin Network (furniture):  747-244-2686  [facility] Helping Hands: ([phone]  Low Income Energy Assistance  580-201-8310   Food Assistance DHHS- SNAP/ Food Stamps: (254) [phone]  WIC: Manley [phone] ;  [place] ([phone]  Layne Benton Book- Free Meals  Little Blue Book- Free Food Pantries  During the summer, text "FOOD" to [phone]   General Health / Clinics (Adults) Orange Card (for Adults) through [facility]: [phone]  [facility]:   [phone]  [facility] Health & Wellness:   [phone]  Health Department:  [phone]  [facility]:   [phone] / [phone]  Planned Parenthood of [place]:   (602)499-3626  [facility]:   [phone]   Housing [place] Housing Coalition:   (564) 566-5578  [facility] Authority:  [phone]  Affordable Housing Managemnt:  (907) [phone]   Immigrant/ Refugee Center for [facility] Trooper):  727-862-3007  Faith Action International House:  (470)[phone]  [employer] Institute:  [phone]  Parks Ranger Services:  (938)[phone]  African Services Coalition:  463-138-9216   LGBTQ YouthSAFE  www.youthsafe[place].org  PFLAG  575-299-1642 / info@[place] Project:  332-319-5319   Mental Health/ Substance Use Family Service of the [facility]  [phone]  [facility] Behavioral Health:  [phone] or [phone]-([phone]  [facility] of Care:  [phone]  [facility]:  [phone]  [facility] Care Services:  [phone]  Vesta Mixer (walk-ins)  2816657123 / [address]  [place]:  [phone]  Alcoholics Anonymous:  519-322-8170  Narcotics Anonymous:  704-190-8234  Quit Smoking Hotline:  800-QUIT-NOW 360 798 8548)   Parenting Children's Home Society:  928-142-9588  [facility] Health: Education Center & Support Groups:  (226)471-8520  YWCA: 602-074-7709  [employer]CG: Bringing Out the Best:  [phone]               Thriving at Three (Hispanic families): 337-215-9487  Healthy Start (Family Service of the [place]):  571-687-8072 [phone]  Parents as Teachers:  [phone]  [place] Child Development- Learning Together (Immigrants): 216-834-7519   Poison Control (906)312-3212  Sports & Recreation [employer] Open Doors Application: https://www.rich.com/  [place] of [place] Recreation Centers: http://www.[place].gov/index.aspx?page=3615   Special Needs Family Support Network:  [phone]  Autism Society of [place]:   408-223-6850 or (323)786-7288 /  (618)212-4120  [facility]:  [phone]  ARC of [place]:  [phone]  Children's Developmental Service Agency (CDSA):  (504)751-7108  [facility] (Care Coordination for Children):  [phone]   Transportation Medicaid Transportation: [phone] to apply  Dallie Piles Authority: 506-558-7760 (reduced-fare bus ID to Medicaid/ Medicare/ Orange Card)  SCAT Paratransit services: Eligible riders only, call (250) [phone] for application   Tutoring/ Mentoring Black Child Development Institute: 779-241-2132  Big Brothers/ Big Sisters: (417)561-3106 [phone])[phone] ([place])  ACES through child's school: (478)788-8183  [employer] Achievers: contact your local [place] Dys Mentor Program: (239) 282-2351     Local mental health resources:  Contact the National Suicide Prevention Hotline (367)544-3539 if you at any time you are experiencing thoughts or feelings of suicide.  WorkplaceDirectory.at via smart phone or computer . By phone: 413-660-1774 or 754-270-1250  [facility] Counseling and Resource Center -[phone]  [facility], [place], [place] [phone]   [facility] [place], [place] [phone]  [employer] of Care Inc. [phone]

## 2023-03-18 NOTE — Care Management (Signed)
OBS Care Management   Writer left a voice mail message with the intake coordinator at the [facility] [phone].   Writer left a Recruitment consultant message with the intake coordinator at the  [employer] [phone].

## 2023-03-18 NOTE — ED Notes (Signed)
Pt given back all her belongings from locker 25.  Pt given discharge instructions along with AVS and resources.  Pt also given bus pass and escorted to sally port and instructions on where to get bus.   Pt denied SI, HI or AVH at time of discharge.
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
## 2023-03-18 NOTE — ED Notes (Signed)
Pt resting quietly.  Breathing even and unlabored.  No distress noted and awaiting dispo.

## 2023-03-18 NOTE — ED Notes (Signed)
Pt resting quietly in view of nursing station.  Breathing even and unlabored.  Cont to monitor for safety.

## 2023-03-18 NOTE — ED Notes (Signed)
Pt is awake and alert.  Flat affect and depressed mood.  Provider in to speak with her at this time.

## 2023-03-18 NOTE — ED Notes (Signed)
Pt given hygiene supplies and towels to take shower.

## 2023-03-18 NOTE — ED Notes (Signed)
Patient observed/assessed in bed/chair resting quietly appearing in no distress and verbalizing no complaints at this time. Will continue to monitor.  

## 2023-04-08 ENCOUNTER — Inpatient Hospital Stay: Payer: Medicaid Other | Attending: Nurse Practitioner

## 2023-05-08 ENCOUNTER — Emergency Department (HOSPITAL_COMMUNITY): Payer: MEDICAID

## 2023-05-08 ENCOUNTER — Emergency Department (HOSPITAL_COMMUNITY)
Admission: EM | Admit: 2023-05-08 | Discharge: 2023-05-08 | Disposition: A | Discharge status: Home / Self Care | Payer: MEDICAID | Attending: Emergency Medicine | Admitting: Emergency Medicine

## 2023-05-08 DIAGNOSIS — E876 Hypokalemia: Secondary | ICD-10-CM | POA: Insufficient documentation

## 2023-05-08 DIAGNOSIS — J101 Influenza due to other identified influenza virus with other respiratory manifestations: Secondary | ICD-10-CM | POA: Insufficient documentation

## 2023-05-08 DIAGNOSIS — Z7982 Long term (current) use of aspirin: Secondary | ICD-10-CM | POA: Insufficient documentation

## 2023-05-08 DIAGNOSIS — Z1152 Encounter for screening for COVID-19: Secondary | ICD-10-CM | POA: Diagnosis not present

## 2023-05-08 DIAGNOSIS — F17210 Nicotine dependence, cigarettes, uncomplicated: Secondary | ICD-10-CM | POA: Insufficient documentation

## 2023-05-08 DIAGNOSIS — R079 Chest pain, unspecified: Secondary | ICD-10-CM | POA: Diagnosis present

## 2023-05-08 DIAGNOSIS — R0789 Other chest pain: Secondary | ICD-10-CM | POA: Insufficient documentation

## 2023-05-08 LAB — CBC
HCT: 35.4 % — ABNORMAL LOW (ref 36.0–46.0)
Hemoglobin: 11 g/dL — ABNORMAL LOW (ref 12.0–15.0)
MCH: 24.9 pg — ABNORMAL LOW (ref 26.0–34.0)
MCHC: 31.1 g/dL (ref 30.0–36.0)
MCV: 80.3 fL (ref 80.0–100.0)
Platelets: 409 10*3/uL — ABNORMAL HIGH (ref 150–400)
RBC: 4.41 MIL/uL (ref 3.87–5.11)
RDW: 17.5 % — ABNORMAL HIGH (ref 11.5–15.5)
WBC: 6.8 10*3/uL (ref 4.0–10.5)
nRBC: 0 % (ref 0.0–0.2)

## 2023-05-08 LAB — BASIC METABOLIC PANEL
Anion gap: 9 (ref 5–15)
BUN: 11 mg/dL (ref 6–20)
CO2: 23 mmol/L (ref 22–32)
Calcium: 8.6 mg/dL — ABNORMAL LOW (ref 8.9–10.3)
Chloride: 105 mmol/L (ref 98–111)
Creatinine, Ser: 0.69 mg/dL (ref 0.44–1.00)
GFR, Estimated: >60 mL/min (ref >60)
Glucose, Bld: 98 mg/dL (ref 70–99)
Potassium: 3.2 mmol/L — ABNORMAL LOW (ref 3.5–5.1)
Sodium: 137 mmol/L (ref 135–145)

## 2023-05-08 LAB — RESP PANEL BY RT-PCR (RSV, FLU A&B, COVID)  RVPGX2
Influenza A by PCR: POSITIVE — AB
Influenza B by PCR: NEGATIVE
Resp Syncytial Virus by PCR: NEGATIVE
SARS Coronavirus 2 by RT PCR: NEGATIVE

## 2023-05-08 LAB — HCG, SERUM, QUALITATIVE: Preg, Serum: NEGATIVE

## 2023-05-08 LAB — TROPONIN I (HIGH SENSITIVITY): Troponin I (High Sensitivity): 3 ng/L (ref <18)

## 2023-05-08 MED ORDER — POTASSIUM CHLORIDE CRYS ER 20 MEQ PO TBCR
40.0000 meq | EXTENDED_RELEASE_TABLET | Freq: Once | ORAL | Status: AC
  Administered 2023-05-08: 40 meq via ORAL
  Filled 2023-05-08: qty 2

## 2023-05-08 MED ORDER — ALBUTEROL SULFATE HFA 108 (90 BASE) MCG/ACT IN AERS
2.0000 | INHALATION_SPRAY | RESPIRATORY_TRACT | Status: DC
  Administered 2023-05-08: 2 via RESPIRATORY_TRACT
  Filled 2023-05-08: qty 6.7

## 2023-05-08 NOTE — ED Triage Notes (Signed)
Pt reports chest pain pta , pt reports chest pain has now resolved, pt reports when she was having pain she could not breathe. Pt talking in full sentences, pt appears to be in NAD at this time.

## 2023-05-08 NOTE — ED Provider Notes (Addendum)
Allison Woodard EMERGENCY DEPARTMENT AT [facility] Provider Note   CSN: [id number] Arrival date & time: 05/08/23  0243     History  Chief Complaint  Patient presents with   Chest Pain    Allison Woodard is a 33 y.o. female.  33 year old female presents with chest discomfort which again yesterday.  States it happened while she was at rest and lasted for about 10 minutes.  She describes her symptoms as a tightness and associate with wheezing.  No associated dizziness, diaphoresis, nausea or vomiting.  Symptoms have since resolved.  Patient states that she does smoke cigarettes and denies any illicit drug use.  Denies any leg pain or swelling.  No prior history of DVT or ACS.  No recurrence of the symptoms       Home Medications Prior to Admission medications   Medication Sig Start Date End Date Taking? Authorizing Provider  aspirin EC 81 MG tablet Take 1 tablet (81 mg total) by mouth daily. Swallow whole. Patient not taking: Reported on 03/04/2023 02/06/23   Lorri Frederick, MD  ferrous sulfate 325 (65 FE) MG EC tablet Take 1 tablet (325 mg total) by mouth daily. Patient not taking: Reported on 03/17/2023 03/04/23   Carlean Jews, NP  Multiple Vitamin (MULTIVITAMIN WITH MINERALS) TABS tablet Take 1 tablet by mouth daily. Patient not taking: Reported on 03/04/2023 02/06/23   Carrion-Carrero, Karle Starch, MD  nicotine (NICODERM CQ - DOSED IN MG/24 HR) 7 mg/24hr patch Place 1 patch (7 mg total) onto the skin daily as needed (NRT). Patient not taking: Reported on 03/04/2023 02/05/23   Lorri Frederick, MD  thiamine (VITAMIN B-1) 100 MG tablet Take 1 tablet (100 mg total) by mouth daily. Patient not taking: Reported on 03/04/2023 02/06/23   Lorri Frederick, MD      Allergies    Patient has no known allergies.    Review of Systems   Review of Systems  All other systems reviewed and are negative.   Physical Exam Updated Vital Signs BP (!) 127/100    Pulse 86   Temp 98.1 F (36.7 C) (Oral)   Resp 14   LMP 04/07/2023   SpO2 98%  Physical Exam Vitals and nursing note reviewed.  Constitutional:      General: She is not in acute distress.    Appearance: Normal appearance. She is well-developed. She is not toxic-appearing.  HENT:     Head: Normocephalic and atraumatic.  Eyes:     General: Lids are normal.     Conjunctiva/sclera: Conjunctivae normal.     Pupils: Pupils are equal, round, and reactive to light.  Neck:     Thyroid: No thyroid mass.     Trachea: No tracheal deviation.  Cardiovascular:     Rate and Rhythm: Normal rate and regular rhythm.     Heart sounds: Normal heart sounds. No murmur heard.    No gallop.  Pulmonary:     Effort: Pulmonary effort is normal. No respiratory distress.     Breath sounds: Normal breath sounds. No stridor. No decreased breath sounds, wheezing, rhonchi or rales.  Abdominal:     General: There is no distension.     Palpations: Abdomen is soft.     Tenderness: There is no abdominal tenderness. There is no rebound.  Musculoskeletal:        General: No tenderness. Normal range of motion.     Cervical back: Normal range of motion and neck supple.  Skin:    General: Skin  is warm and dry.     Findings: No abrasion or rash.  Neurological:     Mental Status: She is alert and oriented to person, place, and time. Mental status is at baseline.     GCS: GCS eye subscore is 4. GCS verbal subscore is 5. GCS motor subscore is 6.     Cranial Nerves: No cranial nerve deficit.     Sensory: No sensory deficit.     Motor: Motor function is intact.  Psychiatric:        Attention and Perception: Attention normal.        Speech: Speech normal.        Behavior: Behavior normal.     ED Results / Procedures / Treatments   Labs (all labs ordered are listed, but only abnormal results are displayed) Labs Reviewed  BASIC METABOLIC PANEL - Abnormal; Notable for the following components:      Result Value    Potassium 3.2 (*)    Calcium 8.6 (*)    All other components within normal limits  CBC - Abnormal; Notable for the following components:   Hemoglobin 11.0 (*)    HCT 35.4 (*)    MCH 24.9 (*)    RDW 17.5 (*)    Platelets 409 (*)    All other components within normal limits  RESP PANEL BY RT-PCR (RSV, FLU A&B, COVID)  RVPGX2  HCG, SERUM, QUALITATIVE  TROPONIN I (HIGH SENSITIVITY)  TROPONIN I (HIGH SENSITIVITY)    EKG EKG Interpretation Date/Time:  Thursday May 08 2023 02:51:36 EST Ventricular Rate:  101 PR Interval:  116 QRS Duration:  72 QT Interval:  333 QTC Calculation: 432 R Axis:   59  Text Interpretation: Sinus tachycardia No significant change since last tracing Confirmed by Melene Plan [phone]) on 05/08/2023 8:15:16 AM  Radiology DG Chest Port 1 View Result Date: 05/08/2023 CLINICAL DATA:  [id number] Chest pain [id number] Pt reports chest pain pta , pt reports chest pain has now resolved, pt reports when she was having pain she could not breathe. Pt talking in full sentences, pt appears to be in NAD at this time. EXAM: PORTABLE CHEST 1 VIEW COMPARISON:  Chest x-ray 01/20/2023, FINDINGS: The heart and mediastinal contours are within normal limits. Chronic stable 5 mm left lower lobe calcified granuloma. No focal consolidation. No pulmonary edema. No pleural effusion. No pneumothorax. No acute osseous abnormality. IMPRESSION: No active disease. Electronically Signed   By: Tish Frederickson M.D.   On: 05/08/2023 03:56    Procedures Procedures    Medications Ordered in ED Medications - No data to display  ED Course/ Medical Decision Making/ A&P                                 Medical Decision Making Amount and/or Complexity of Data Reviewed Labs: ordered.  Risk Prescription drug management.  Patient with mild hypokalemia and treated with oral potassium. Patient's EKG interpretation shows no signs of ACS.  She is normal sinus rhythm.  Chest x-ray per my interpretation is  negative as well 2.  Patient's lungs are at this time.  Suspect patient episode of bronchospasm.  Will give patient albuterol inhaler to go home with.  COVID and flu test are pending at this time.  Patient will follow-up on MyChart for this.  Low suspicion for PE.  Will discharge        Final Clinical Impression(s) / ED Diagnoses  Final diagnoses:  None    Rx / DC Orders ED Discharge Orders     None         Lorre Nick, MD 05/08/23 [id number]    Lorre Nick, MD 05/08/23 [phone]

## 2023-07-04 ENCOUNTER — Other Ambulatory Visit: Payer: Self-pay

## 2023-07-04 DIAGNOSIS — D75839 Thrombocytosis, unspecified: Secondary | ICD-10-CM

## 2023-07-04 DIAGNOSIS — D500 Iron deficiency anemia secondary to blood loss (chronic): Secondary | ICD-10-CM

## 2023-07-07 ENCOUNTER — Inpatient Hospital Stay: Payer: MEDICAID | Attending: Nurse Practitioner

## 2023-07-28 ENCOUNTER — Other Ambulatory Visit: Payer: Self-pay

## 2023-07-28 ENCOUNTER — Emergency Department (HOSPITAL_COMMUNITY): Payer: MEDICAID

## 2023-07-28 ENCOUNTER — Emergency Department (HOSPITAL_COMMUNITY)
Admission: EM | Admit: 2023-07-28 | Discharge: 2023-07-28 | Disposition: A | Discharge status: Home / Self Care | Payer: MEDICAID | Attending: Emergency Medicine | Admitting: Emergency Medicine

## 2023-07-28 DIAGNOSIS — Z5900 Homelessness unspecified: Secondary | ICD-10-CM | POA: Insufficient documentation

## 2023-07-28 DIAGNOSIS — R400 Somnolence: Secondary | ICD-10-CM | POA: Diagnosis not present

## 2023-07-28 DIAGNOSIS — S8012XA Contusion of left lower leg, initial encounter: Secondary | ICD-10-CM | POA: Insufficient documentation

## 2023-07-28 DIAGNOSIS — Z7982 Long term (current) use of aspirin: Secondary | ICD-10-CM | POA: Diagnosis not present

## 2023-07-28 DIAGNOSIS — S8011XA Contusion of right lower leg, initial encounter: Secondary | ICD-10-CM | POA: Insufficient documentation

## 2023-07-28 DIAGNOSIS — S40022A Contusion of left upper arm, initial encounter: Secondary | ICD-10-CM | POA: Insufficient documentation

## 2023-07-28 DIAGNOSIS — S0083XA Contusion of other part of head, initial encounter: Secondary | ICD-10-CM | POA: Insufficient documentation

## 2023-07-28 DIAGNOSIS — R4182 Altered mental status, unspecified: Secondary | ICD-10-CM | POA: Diagnosis present

## 2023-07-28 DIAGNOSIS — X58XXXA Exposure to other specified factors, initial encounter: Secondary | ICD-10-CM | POA: Insufficient documentation

## 2023-07-28 DIAGNOSIS — S40021A Contusion of right upper arm, initial encounter: Secondary | ICD-10-CM | POA: Diagnosis not present

## 2023-07-28 DIAGNOSIS — R748 Abnormal levels of other serum enzymes: Secondary | ICD-10-CM | POA: Insufficient documentation

## 2023-07-28 DIAGNOSIS — F1910 Other psychoactive substance abuse, uncomplicated: Secondary | ICD-10-CM | POA: Diagnosis not present

## 2023-07-28 LAB — CBC WITH DIFFERENTIAL/PLATELET
Abs Immature Granulocytes: 0.03 10*3/uL (ref 0.00–0.07)
Basophils Absolute: 0.1 10*3/uL (ref 0.0–0.1)
Basophils Relative: 1 %
Eosinophils Absolute: 0.2 10*3/uL (ref 0.0–0.5)
Eosinophils Relative: 2 %
HCT: 40 % (ref 36.0–46.0)
Hemoglobin: 12.7 g/dL (ref 12.0–15.0)
Immature Granulocytes: 0 %
Lymphocytes Relative: 40 %
Lymphs Abs: 3.9 10*3/uL (ref 0.7–4.0)
MCH: 28 pg (ref 26.0–34.0)
MCHC: 31.8 g/dL (ref 30.0–36.0)
MCV: 88.1 fL (ref 80.0–100.0)
Monocytes Absolute: 0.9 10*3/uL (ref 0.1–1.0)
Monocytes Relative: 9 %
Neutro Abs: 4.8 10*3/uL (ref 1.7–7.7)
Neutrophils Relative %: 48 %
Platelets: 343 10*3/uL (ref 150–400)
RBC: 4.54 MIL/uL (ref 3.87–5.11)
RDW: 16.2 % — ABNORMAL HIGH (ref 11.5–15.5)
WBC: 9.8 10*3/uL (ref 4.0–10.5)
nRBC: 0 % (ref 0.0–0.2)

## 2023-07-28 LAB — RAPID URINE DRUG SCREEN, HOSP PERFORMED
Amphetamines: POSITIVE — AB
Barbiturates: NOT DETECTED
Benzodiazepines: NOT DETECTED
Cocaine: POSITIVE — AB
Opiates: NOT DETECTED
Tetrahydrocannabinol: POSITIVE — AB

## 2023-07-28 LAB — URINALYSIS, ROUTINE W REFLEX MICROSCOPIC
Bilirubin Urine: NEGATIVE
Glucose, UA: NEGATIVE mg/dL
Hgb urine dipstick: NEGATIVE
Ketones, ur: 5 mg/dL — AB
Nitrite: NEGATIVE
Protein, ur: 30 mg/dL — AB
Specific Gravity, Urine: 1.03 (ref 1.005–1.030)
pH: 5 (ref 5.0–8.0)

## 2023-07-28 LAB — COMPREHENSIVE METABOLIC PANEL WITH GFR
ALT: 40 U/L (ref 0–44)
AST: 49 U/L — ABNORMAL HIGH (ref 15–41)
Albumin: 3.6 g/dL (ref 3.5–5.0)
Alkaline Phosphatase: 59 U/L (ref 38–126)
Anion gap: 6 (ref 5–15)
BUN: 13 mg/dL (ref 6–20)
CO2: 27 mmol/L (ref 22–32)
Calcium: 8.8 mg/dL — ABNORMAL LOW (ref 8.9–10.3)
Chloride: 103 mmol/L (ref 98–111)
Creatinine, Ser: 0.71 mg/dL (ref 0.44–1.00)
GFR, Estimated: >60 mL/min (ref >60)
Glucose, Bld: 116 mg/dL — ABNORMAL HIGH (ref 70–99)
Potassium: 3.3 mmol/L — ABNORMAL LOW (ref 3.5–5.1)
Sodium: 136 mmol/L (ref 135–145)
Total Bilirubin: 0.5 mg/dL (ref 0.0–1.2)
Total Protein: 7.5 g/dL (ref 6.5–8.1)

## 2023-07-28 LAB — CK: Total CK: 299 U/L — ABNORMAL HIGH (ref 38–234)

## 2023-07-28 LAB — ETHANOL: Alcohol, Ethyl (B): <10 mg/dL (ref <10)

## 2023-07-28 LAB — BLOOD GAS, VENOUS
Acid-Base Excess: 2.1 mmol/L — ABNORMAL HIGH (ref 0.0–2.0)
Bicarbonate: 28.7 mmol/L — ABNORMAL HIGH (ref 20.0–28.0)
O2 Saturation: 79.6 %
Patient temperature: 37
pCO2, Ven: 52 mmHg (ref 44–60)
pH, Ven: 7.35 (ref 7.25–7.43)
pO2, Ven: 47 mmHg — ABNORMAL HIGH (ref 32–45)

## 2023-07-28 LAB — ACETAMINOPHEN LEVEL: Acetaminophen (Tylenol), Serum: <10 ug/mL — ABNORMAL LOW (ref 10–30)

## 2023-07-28 LAB — CBG MONITORING, ED: Glucose-Capillary: 106 mg/dL — ABNORMAL HIGH (ref 70–99)

## 2023-07-28 LAB — SALICYLATE LEVEL: Salicylate Lvl: <7 mg/dL — ABNORMAL LOW (ref 7.0–30.0)

## 2023-07-28 LAB — HCG, QUANTITATIVE, PREGNANCY: hCG, Beta Chain, Quant, S: <1 m[IU]/mL (ref <5)

## 2023-07-28 MED ORDER — LACTATED RINGERS IV BOLUS
1000.0000 mL | Freq: Once | INTRAVENOUS | Status: AC
Start: 2023-07-28 — End: 2023-07-28
  Administered 2023-07-28: 1000 mL via INTRAVENOUS

## 2023-07-28 MED ORDER — ONDANSETRON HCL 4 MG/2ML IJ SOLN
4.0000 mg | Freq: Four times a day (QID) | INTRAMUSCULAR | Status: DC | PRN
Start: 2023-07-28 — End: 2023-07-29
  Administered 2023-07-28: 4 mg via INTRAVENOUS
  Filled 2023-07-28: qty 2
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
## 2023-08-24 ENCOUNTER — Other Ambulatory Visit: Payer: Self-pay

## 2023-08-24 ENCOUNTER — Emergency Department (HOSPITAL_COMMUNITY)
Admission: EM | Admit: 2023-08-24 | Discharge: 2023-08-24 | Discharge status: Against Medical Advice | Payer: MEDICAID | Attending: Emergency Medicine | Admitting: Emergency Medicine

## 2023-08-24 ENCOUNTER — Encounter (HOSPITAL_COMMUNITY): Payer: Self-pay | Admitting: *Deleted

## 2023-08-24 DIAGNOSIS — T401X1A Poisoning by heroin, accidental (unintentional), initial encounter: Secondary | ICD-10-CM | POA: Diagnosis present

## 2023-08-24 DIAGNOSIS — Z5321 Procedure and treatment not carried out due to patient leaving prior to being seen by health care provider: Secondary | ICD-10-CM | POA: Insufficient documentation

## 2023-08-24 NOTE — ED Triage Notes (Signed)
 Pt arrives via [facility], she is coming from home, with overdose. Prior to EMS arrival, she was given Narcan  4 intranasal by PD. On EMS arrival, she was minimally responsive.  NRB placed until she became more responsive.  Now, more responsive, through transport, 98% ra, gcs 14. Admits that she Snorted heroin. 158/90, hr rr 16, 98% ra. Cbg 186,

## 2023-08-24 NOTE — ED Notes (Signed)
 Signature pad not working, pt verbalizes understanding of MSE screening.

## 2023-08-24 NOTE — ED Triage Notes (Signed)
 Pt says she snorted heroin today. Denies any other drug use. Pt is alert and oriented in triage.   Refused labs in triage.

## 2023-08-24 NOTE — ED Notes (Signed)
 Per Cornelius Dill, she saw patient  ambulating through halls and said she was leaving

## 2023-10-05 ENCOUNTER — Other Ambulatory Visit: Payer: Self-pay | Admitting: Nurse Practitioner

## 2023-10-05 DIAGNOSIS — D75839 Thrombocytosis, unspecified: Secondary | ICD-10-CM

## 2023-10-05 DIAGNOSIS — D500 Iron deficiency anemia secondary to blood loss (chronic): Secondary | ICD-10-CM

## 2023-10-05 NOTE — Assessment & Plan Note (Deleted)
 01/20/2023 - hospitalized for TBI. Had severely elevated platelet count, as high as 926. She also had mild leukocytosis and mild iron deficiency anemia. -Several subsequent ED visits indicate persistent elevation of platelets, however gradually improving. -Discussed several common causes of thrombocytosis including essential thrombocytosis, thrombocytosis secondary to iron deficiency anemia, myeloproliferative diseases, and less commonly, cancer related thrombocytosis.  -Check labs today to further evaluate thrombocytosis and iron deficiency anemia.  Will contact patient with results.  Plan to recheck labs in 1 month and in 3 months.  Will follow-up in 6 months with additional lab check. - Labs from initial visit in November 2024 showed iron deficiency with ferritin 10, iron 17, TIBC 567, and saturation ratio of 3%.  Her Hgb was 11.7 and HCT was 36.8.  The patient had been contacted to set up IV iron infusions.  We were unable to make contact with the patient to set up treatment.  She has had multiple visits to the ED for polysubstance abuse.

## 2023-10-05 NOTE — Progress Notes (Deleted)
 Patient Care Team: Pcp, No as PCP - General  Clinic Day:  10/05/2023  Referring physician: No ref. provider found  ASSESSMENT & PLAN:   Assessment & Plan: Thrombocytosis 01/20/2023 - hospitalized for TBI. Had severely elevated platelet count, as high as 926. She also had mild leukocytosis and mild iron deficiency anemia. -Several subsequent ED visits indicate persistent elevation of platelets, however gradually improving. -Discussed several common causes of thrombocytosis including essential thrombocytosis, thrombocytosis secondary to iron deficiency anemia, myeloproliferative diseases, and less commonly, cancer related thrombocytosis.  -Check labs today to further evaluate thrombocytosis and iron deficiency anemia.  Will contact patient with results.  Plan to recheck labs in 1 month and in 3 months.  Will follow-up in 6 months with additional lab check. - Labs from initial visit in November 2024 showed iron deficiency with ferritin 10, iron 17, TIBC 567, and saturation ratio of 3%.  Her Hgb was 11.7 and HCT was 36.8.  The patient had been contacted to set up IV iron infusions.  We were unable to make contact with the patient to set up treatment.  She has had multiple visits to the ED for polysubstance abuse.    The patient understands the plans discussed today and is in agreement with them.  She knows to contact our office if she develops concerns prior to her next appointment.  I provided *** minutes of face-to-face time during this encounter and > 50% was spent counseling as documented under my assessment and plan.    Powell FORBES Lessen, NP  [facility] - A DEPT OF [facility] [address] [place] [address] Dept: [phone] Dept Fax: [phone]   No orders of the defined types were placed in this encounter.     CHIEF COMPLAINT:  CC: Thrombocytosis  Current Treatment:  IV iron if indicated  INTERVAL  HISTORY:  Allison Woodard is here today for repeat clinical assessment.  Initial visit with me 03/04/2023.  She was moderately iron deficient with ferritin 10, iron 17, TIBC 567, and saturation ratio of 3%.  Her Hgb was 11.7 and HCT was 36.8.  The patient had been contacted to set up IV iron infusions.  We were unable to make contact with the patient to set up treatment.  She has had multiple visits to the ED for polysubstance abuse.  She denies fevers or chills. She denies pain. Her appetite is good. Her weight {Weight change:10426}.  I have reviewed the past medical history, past surgical history, social history and family history with the patient and they are unchanged from previous note.  ALLERGIES:  has no known allergies.  MEDICATIONS:  Current Outpatient Medications  Medication Sig Dispense Refill   aspirin  EC 81 MG tablet Take 1 tablet (81 mg total) by mouth daily. Swallow whole. (Patient not taking: Reported on 03/04/2023) 30 tablet 0   ferrous sulfate  325 (65 FE) MG EC tablet Take 1 tablet (325 mg total) by mouth daily. (Patient not taking: Reported on 03/17/2023) 30 tablet 3   Multiple Vitamin (MULTIVITAMIN WITH MINERALS) TABS tablet Take 1 tablet by mouth daily. (Patient not taking: Reported on 03/04/2023) 30 tablet 0   nicotine  (NICODERM CQ  - DOSED IN MG/24 HR) 7 mg/24hr patch Place 1 patch (7 mg total) onto the skin daily as needed (NRT). (Patient not taking: Reported on 03/04/2023) 28 patch 0   thiamine  (VITAMIN B-1) 100 MG tablet Take 1 tablet (100 mg total) by mouth  daily. (Patient not taking: Reported on 03/04/2023) 30 tablet 0   No current facility-administered medications for this visit.    HISTORY OF PRESENT ILLNESS:   Oncology History   No history exists.      REVIEW OF SYSTEMS:   Constitutional: Denies fevers, chills or abnormal weight loss Eyes: Denies blurriness of vision Ears, nose, mouth, throat, and face: Denies mucositis or sore throat Respiratory: Denies cough,  dyspnea or wheezes Cardiovascular: Denies palpitation, chest discomfort or lower extremity swelling Gastrointestinal:  Denies nausea, heartburn or change in bowel habits Skin: Denies abnormal skin rashes Lymphatics: Denies new lymphadenopathy or easy bruising Neurological:Denies numbness, tingling or new weaknesses Behavioral/Psych: Mood is stable, no new changes  All other systems were reviewed with the patient and are negative.   VITALS:  There were no vitals taken for this visit.  Wt Readings from Last 3 Encounters:  03/04/23 119 lb 3.2 oz (54.1 kg)  02/22/23 111 lb (50.3 kg)  02/04/23 111 lb 5.3 oz (50.5 kg)    There is no height or weight on file to calculate BMI.  Performance status (ECOG): {CHL ONC [id number]  PHYSICAL EXAM:   GENERAL:alert, no distress and comfortable SKIN: skin color, texture, turgor are normal, no rashes or significant lesions EYES: normal, Conjunctiva are pink and non-injected, sclera clear OROPHARYNX:no exudate, no erythema and lips, buccal mucosa, and tongue normal  NECK: supple, thyroid  normal size, non-tender, without nodularity LYMPH:  no palpable lymphadenopathy in the cervical, axillary or inguinal LUNGS: clear to auscultation and percussion with normal breathing effort HEART: regular rate & rhythm and no murmurs and no lower extremity edema ABDOMEN:abdomen soft, non-tender and normal bowel sounds Musculoskeletal:no cyanosis of digits and no clubbing  NEURO: alert & oriented x 3 with fluent speech, no focal motor/sensory deficits  LABORATORY DATA:  I have reviewed the data as listed    Component Value Date/Time   NA 136 07/28/2023 1728   K 3.3 (L) 07/28/2023 1728   CL 103 07/28/2023 1728   CO2 27 07/28/2023 1728   GLUCOSE 116 (H) 07/28/2023 1728   BUN 13 07/28/2023 1728   CREATININE 0.71 07/28/2023 1728   CREATININE 0.48 03/04/2023 1537   CALCIUM 8.8 (L) 07/28/2023 1728   PROT 7.5 07/28/2023 1728   ALBUMIN  3.6 07/28/2023 1728    AST 49 (H) 07/28/2023 1728   AST 58 (H) 03/04/2023 1537   ALT 40 07/28/2023 1728   ALT 62 (H) 03/04/2023 1537   ALKPHOS 59 07/28/2023 1728   BILITOT 0.5 07/28/2023 1728   BILITOT 0.3 03/04/2023 1537   GFRNONAA >60 07/28/2023 1728   GFRNONAA >60 03/04/2023 1537   GFRAA >60 08/22/2019 1630    No results found for: SPEP, UPEP  Lab Results  Component Value Date   WBC 9.8 07/28/2023   NEUTROABS 4.8 07/28/2023   HGB 12.7 07/28/2023   HCT 40.0 07/28/2023   MCV 88.1 07/28/2023   PLT 343 07/28/2023      Chemistry      Component Value Date/Time   NA 136 07/28/2023 1728   K 3.3 (L) 07/28/2023 1728   CL 103 07/28/2023 1728   CO2 27 07/28/2023 1728   BUN 13 07/28/2023 1728   CREATININE 0.71 07/28/2023 1728   CREATININE 0.48 03/04/2023 1537      Component Value Date/Time   CALCIUM 8.8 (L) 07/28/2023 1728   ALKPHOS 59 07/28/2023 1728   AST 49 (H) 07/28/2023 1728   AST 58 (H) 03/04/2023 1537   ALT 40 07/28/2023  1728   ALT 62 (H) 03/04/2023 1537   BILITOT 0.5 07/28/2023 1728   BILITOT 0.3 03/04/2023 1537       RADIOGRAPHIC STUDIES: I have personally reviewed the radiological images as listed and agreed with the findings in the report. No results found.

## 2023-10-06 ENCOUNTER — Inpatient Hospital Stay: Payer: MEDICAID | Admitting: Nurse Practitioner

## 2023-10-06 ENCOUNTER — Inpatient Hospital Stay: Payer: MEDICAID | Attending: Nurse Practitioner

## 2023-10-06 ENCOUNTER — Other Ambulatory Visit: Payer: Self-pay

## 2023-10-06 DIAGNOSIS — D75839 Thrombocytosis, unspecified: Secondary | ICD-10-CM

## 2023-10-06 NOTE — Progress Notes (Signed)
 Called patient due to not showing up for her 1pm lab app and 130 OV. No answer and phone number does not work. Will no show patient for appointments today.

## 2023-10-25 ENCOUNTER — Emergency Department (HOSPITAL_COMMUNITY)
Admission: EM | Admit: 2023-10-25 | Discharge: 2023-10-26 | Disposition: A | Discharge status: Home / Self Care | Payer: MEDICAID | Attending: Emergency Medicine | Admitting: Emergency Medicine

## 2023-10-25 ENCOUNTER — Encounter (HOSPITAL_COMMUNITY): Payer: Self-pay | Admitting: *Deleted

## 2023-10-25 ENCOUNTER — Other Ambulatory Visit: Payer: Self-pay

## 2023-10-25 ENCOUNTER — Emergency Department (HOSPITAL_COMMUNITY): Payer: MEDICAID

## 2023-10-25 DIAGNOSIS — W228XXA Striking against or struck by other objects, initial encounter: Secondary | ICD-10-CM | POA: Diagnosis not present

## 2023-10-25 DIAGNOSIS — Z7982 Long term (current) use of aspirin: Secondary | ICD-10-CM | POA: Diagnosis not present

## 2023-10-25 DIAGNOSIS — S63283A Dislocation of proximal interphalangeal joint of left middle finger, initial encounter: Secondary | ICD-10-CM | POA: Insufficient documentation

## 2023-10-25 DIAGNOSIS — S63259A Unspecified dislocation of unspecified finger, initial encounter: Secondary | ICD-10-CM

## 2023-10-25 DIAGNOSIS — S62613A Displaced fracture of proximal phalanx of left middle finger, initial encounter for closed fracture: Secondary | ICD-10-CM | POA: Diagnosis not present

## 2023-10-25 DIAGNOSIS — S6992XA Unspecified injury of left wrist, hand and finger(s), initial encounter: Secondary | ICD-10-CM | POA: Diagnosis present

## 2023-10-25 MED ORDER — LIDOCAINE HCL (PF) 1 % IJ SOLN
5.0000 mL | Freq: Once | INTRAMUSCULAR | Status: AC
  Administered 2023-10-26: 5 mL
  Filled 2023-10-25: qty 5

## 2023-10-26 NOTE — Discharge Instructions (Addendum)
 Keep your finger splinted. Follow up with orthopedics, call on Monday to schedule an appointment. Motrin  and Tylenol  as needed as directed.

## 2023-10-26 NOTE — ED Provider Notes (Signed)
 [place] EMERGENCY DEPARTMENT AT [facility] Provider Note   CSN: [id number] Arrival date & time: 10/25/23  2213     Patient presents with: lt middle finger injury   Allison Woodard is a 33 y.o. female.   33 year old right-hand-dominant female presents with complaint of injury to her left third finger.  Patient states that she fell about 3 weeks ago and injured the finger.  She hit it a few days ago and it has been painful ever since.  No other injuries, complaints, concerns.       Prior to Admission medications   Medication Sig Start Date End Date Taking? Authorizing Provider  aspirin  EC 81 MG tablet Take 1 tablet (81 mg total) by mouth daily. Swallow whole. Patient not taking: Reported on 03/04/2023 02/06/23   Homer Shams, MD  ferrous sulfate  325 (65 FE) MG EC tablet Take 1 tablet (325 mg total) by mouth daily. Patient not taking: Reported on 03/17/2023 03/04/23   Boscia, Heather E, NP  Multiple Vitamin (MULTIVITAMIN WITH MINERALS) TABS tablet Take 1 tablet by mouth daily. Patient not taking: Reported on 03/04/2023 02/06/23   Homer Shams, MD  nicotine  (NICODERM CQ  - DOSED IN MG/24 HR) 7 mg/24hr patch Place 1 patch (7 mg total) onto the skin daily as needed (NRT). Patient not taking: Reported on 03/04/2023 02/05/23   Homer Shams, MD  thiamine  (VITAMIN B-1) 100 MG tablet Take 1 tablet (100 mg total) by mouth daily. Patient not taking: Reported on 03/04/2023 02/06/23   Homer Shams, MD    Allergies: Patient has no known allergies.    Review of Systems Negative except as per HPI Updated Vital Signs BP (!) 135/92 (BP Location: Right Arm)   Pulse (!) 102   Temp 98.3 F (36.8 C)   Resp 16   Ht 5' 2 (1.575 m)   Wt 54.1 kg   LMP 10/03/2023   SpO2 100%   BMI 21.81 kg/m   Physical Exam Vitals and nursing note reviewed.  Constitutional:      General: She is not in acute distress.    Appearance: She is  well-developed. She is not diaphoretic.  HENT:     Head: Normocephalic and atraumatic.  Pulmonary:     Effort: Pulmonary effort is normal.  Musculoskeletal:     Comments: Left 3rd finger +deformity, limited ROM, sensation intact   Neurological:     Mental Status: She is alert and oriented to person, place, and time.  Psychiatric:        Behavior: Behavior normal.     (all labs ordered are listed, but only abnormal results are displayed) Labs Reviewed - No data to display  EKG: None  Radiology: DG Finger Middle Left Result Date: 10/25/2023 CLINICAL DATA:  Status post trauma 3 weeks ago. EXAM: LEFT MIDDLE FINGER 2+V COMPARISON:  None Available. FINDINGS: There is dorsal dislocation of the PIP joint of the third left finger. Small associated fractures of the distal aspect of the proximal phalanx and base of the middle phalanx are noted. Moderate severity surrounding soft tissue swelling is present. IMPRESSION: Dorsal dislocation of the PIP joint of the third left finger with small associated fractures of the distal aspect of the proximal phalanx and base of the middle phalanx. Electronically Signed   By: Suzen Dials M.D.   On: 10/25/2023 23:04     .Reduction of dislocation  Date/Time: 10/26/2023 12:53 AM  Performed by: Beverley Leita LABOR, PA-C Authorized by: Beverley Leita LABOR, PA-C  Consent:  Verbal consent obtained Risks and benefits: risks, benefits and alternatives were discussed Consent given by: patient Patient identity confirmed: verbally with patient Local anesthesia used: yes Anesthesia: digital block  Anesthesia: Local anesthesia used: yes Local Anesthetic: lidocaine  1% without epinephrine  Sedation: Patient sedated: no  Comments: Tolerated well, unsuccessful. Discussed possibility of fracture, nerve injury, incomplete or unsuccessful reduction, need to surgical fix in the future.       Medications Ordered in the ED  lidocaine  (PF) (XYLOCAINE ) 1 % injection 5  mL (5 mLs Infiltration Given by Other 10/26/23 0022)                                    Medical Decision Making Risk Prescription drug management.   33 year old female presents with injured left 3rd finger, right hand dominant. Patient states she fell 3 weeks ago and injured it, states + deformity, hit the hand a few days ago and it has been painful ever since.  Xr of the left 3rd finger with dislocation and small fracture. Appears calcified to dorsal aspect of the proximal phalanx. Agree with radiologist interpretation.   Digital block with 1% plain lidocaine  successful. Attempted reduction, unsuccessful. Finger splinted advised to call hand ortho Monday for follow up.     Final diagnoses:  Dislocated finger, initial encounter  Closed displaced fracture of proximal phalanx of left middle finger, initial encounter    ED Discharge Orders     None          Beverley Leita LABOR, PA-C 10/26/23 9944    Jerral Meth, MD 10/26/23 0100

## 2023-10-26 NOTE — ED Notes (Signed)
Finger splinted.  
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
## 2023-10-29 ENCOUNTER — Ambulatory Visit: Payer: MEDICAID | Admitting: Orthopedic Surgery

## 2023-10-29 ENCOUNTER — Encounter (HOSPITAL_BASED_OUTPATIENT_CLINIC_OR_DEPARTMENT_OTHER): Payer: Self-pay | Admitting: Orthopedic Surgery

## 2023-10-31 ENCOUNTER — Encounter (HOSPITAL_BASED_OUTPATIENT_CLINIC_OR_DEPARTMENT_OTHER): Admission: RE | Payer: Self-pay | Source: Home / Self Care

## 2023-10-31 ENCOUNTER — Ambulatory Visit (HOSPITAL_BASED_OUTPATIENT_CLINIC_OR_DEPARTMENT_OTHER): Admission: RE | Admit: 2023-10-31 | Payer: MEDICAID | Source: Home / Self Care | Admitting: Orthopedic Surgery

## 2023-10-31 HISTORY — DX: Post-traumatic stress disorder, unspecified: F43.10

## 2023-10-31 HISTORY — DX: Thrombocytosis, unspecified: D75.839

## 2023-10-31 HISTORY — DX: Poisoning by unspecified drugs, medicaments and biological substances, accidental (unintentional), initial encounter: T50.901A

## 2023-10-31 HISTORY — DX: Anxiety disorder, unspecified: F41.9

## 2023-10-31 HISTORY — DX: Prediabetes: R73.03

## 2023-10-31 HISTORY — DX: Other psychoactive substance abuse, uncomplicated: F19.10

## 2023-10-31 SURGERY — OPEN REDUCTION INTERNAL FIXATION (ORIF) HAND
Anesthesia: General | Site: Hand | Laterality: Left

## 2023-11-06 NOTE — Telephone Encounter (Signed)
 This encounter was created in error - please disregard.

## 2023-12-01 ENCOUNTER — Ambulatory Visit (INDEPENDENT_AMBULATORY_CARE_PROVIDER_SITE_OTHER): Payer: MEDICAID | Admitting: Orthopedic Surgery

## 2023-12-01 ENCOUNTER — Other Ambulatory Visit (INDEPENDENT_AMBULATORY_CARE_PROVIDER_SITE_OTHER): Payer: MEDICAID

## 2023-12-01 DIAGNOSIS — S63283A Dislocation of proximal interphalangeal joint of left middle finger, initial encounter: Secondary | ICD-10-CM

## 2023-12-01 DIAGNOSIS — M79645 Pain in left finger(s): Secondary | ICD-10-CM
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
## 2023-12-12 ENCOUNTER — Other Ambulatory Visit: Payer: Self-pay

## 2023-12-12 ENCOUNTER — Emergency Department (HOSPITAL_COMMUNITY)
Admission: EM | Admit: 2023-12-12 | Discharge: 2023-12-12 | Discharge status: Against Medical Advice | Payer: MEDICAID | Attending: Emergency Medicine | Admitting: Emergency Medicine

## 2023-12-12 ENCOUNTER — Encounter (HOSPITAL_COMMUNITY): Payer: Self-pay

## 2023-12-12 DIAGNOSIS — Z5321 Procedure and treatment not carried out due to patient leaving prior to being seen by health care provider: Secondary | ICD-10-CM | POA: Diagnosis not present

## 2023-12-12 DIAGNOSIS — M7989 Other specified soft tissue disorders: Secondary | ICD-10-CM | POA: Insufficient documentation

## 2023-12-12 DIAGNOSIS — R202 Paresthesia of skin: Secondary | ICD-10-CM | POA: Diagnosis not present

## 2023-12-12 LAB — COMPREHENSIVE METABOLIC PANEL WITH GFR
ALT: 22 U/L (ref 0–44)
AST: 33 U/L (ref 15–41)
Albumin: 3.1 g/dL — ABNORMAL LOW (ref 3.5–5.0)
Alkaline Phosphatase: 70 U/L (ref 38–126)
Anion gap: 9 (ref 5–15)
BUN: 12 mg/dL (ref 6–20)
CO2: 22 mmol/L (ref 22–32)
Calcium: 8.8 mg/dL — ABNORMAL LOW (ref 8.9–10.3)
Chloride: 106 mmol/L (ref 98–111)
Creatinine, Ser: 0.65 mg/dL (ref 0.44–1.00)
GFR, Estimated: >60 mL/min (ref >60)
Glucose, Bld: 96 mg/dL (ref 70–99)
Potassium: 4 mmol/L (ref 3.5–5.1)
Sodium: 137 mmol/L (ref 135–145)
Total Bilirubin: 0.2 mg/dL (ref 0.0–1.2)
Total Protein: 6.8 g/dL (ref 6.5–8.1)

## 2023-12-12 LAB — CBC WITH DIFFERENTIAL/PLATELET
Abs Immature Granulocytes: 0.04 K/uL (ref 0.00–0.07)
Basophils Absolute: 0.1 K/uL (ref 0.0–0.1)
Basophils Relative: 1 %
Eosinophils Absolute: 0.4 K/uL (ref 0.0–0.5)
Eosinophils Relative: 4 %
HCT: 37.7 % (ref 36.0–46.0)
Hemoglobin: 12.1 g/dL (ref 12.0–15.0)
Immature Granulocytes: 0 %
Lymphocytes Relative: 45 %
Lymphs Abs: 4.5 K/uL — ABNORMAL HIGH (ref 0.7–4.0)
MCH: 29.6 pg (ref 26.0–34.0)
MCHC: 32.1 g/dL (ref 30.0–36.0)
MCV: 92.2 fL (ref 80.0–100.0)
Monocytes Absolute: 1.1 K/uL — ABNORMAL HIGH (ref 0.1–1.0)
Monocytes Relative: 11 %
Neutro Abs: 3.8 K/uL (ref 1.7–7.7)
Neutrophils Relative %: 39 %
Platelets: 394 K/uL (ref 150–400)
RBC: 4.09 MIL/uL (ref 3.87–5.11)
RDW: 14 % (ref 11.5–15.5)
WBC: 9.9 K/uL (ref 4.0–10.5)
nRBC: 0 % (ref 0.0–0.2)

## 2023-12-12 LAB — I-STAT CHEM 8, ED
BUN: 13 mg/dL (ref 6–20)
Calcium, Ion: 1.17 mmol/L (ref 1.15–1.40)
Chloride: 106 mmol/L (ref 98–111)
Creatinine, Ser: 0.6 mg/dL (ref 0.44–1.00)
Glucose, Bld: 95 mg/dL (ref 70–99)
HCT: 38 % (ref 36.0–46.0)
Hemoglobin: 12.9 g/dL (ref 12.0–15.0)
Potassium: 3.9 mmol/L (ref 3.5–5.1)
Sodium: 139 mmol/L (ref 135–145)
TCO2: 23 mmol/L (ref 22–32)

## 2023-12-12 LAB — I-STAT CG4 LACTIC ACID, ED: Lactic Acid, Venous: 0.6 mmol/L (ref 0.5–1.9)

## 2023-12-12 LAB — HCG, SERUM, QUALITATIVE: Preg, Serum: NEGATIVE

## 2023-12-12 NOTE — ED Triage Notes (Signed)
 Pt states both of her feet started swelling and are tingling. These symptoms started today. Pt denies injury. Pt denies shob.

## 2023-12-12 NOTE — ED Notes (Signed)
Pt not seen in waiting room 

## 2023-12-15 ENCOUNTER — Encounter (HOSPITAL_BASED_OUTPATIENT_CLINIC_OR_DEPARTMENT_OTHER): Payer: Self-pay | Admitting: Orthopedic Surgery

## 2023-12-15 ENCOUNTER — Other Ambulatory Visit: Payer: Self-pay

## 2023-12-15 DIAGNOSIS — S63283A Dislocation of proximal interphalangeal joint of left middle finger, initial encounter: Secondary | ICD-10-CM

## 2023-12-19 ENCOUNTER — Ambulatory Visit (HOSPITAL_BASED_OUTPATIENT_CLINIC_OR_DEPARTMENT_OTHER)
Admission: RE | Admit: 2023-12-19 | Discharge: 2023-12-19 | Disposition: A | Discharge status: Home / Self Care | Payer: MEDICAID | Attending: Orthopedic Surgery | Admitting: Orthopedic Surgery

## 2023-12-19 ENCOUNTER — Other Ambulatory Visit: Payer: Self-pay

## 2023-12-19 ENCOUNTER — Encounter (HOSPITAL_BASED_OUTPATIENT_CLINIC_OR_DEPARTMENT_OTHER)
Admission: RE | Disposition: A | Discharge status: Home / Self Care | Payer: Self-pay | Source: Home / Self Care | Attending: Orthopedic Surgery

## 2023-12-19 ENCOUNTER — Ambulatory Visit (HOSPITAL_BASED_OUTPATIENT_CLINIC_OR_DEPARTMENT_OTHER): Payer: MEDICAID | Admitting: Certified Registered Nurse Anesthetist

## 2023-12-19 ENCOUNTER — Encounter (HOSPITAL_BASED_OUTPATIENT_CLINIC_OR_DEPARTMENT_OTHER): Payer: Self-pay | Admitting: Orthopedic Surgery

## 2023-12-19 ENCOUNTER — Ambulatory Visit (HOSPITAL_BASED_OUTPATIENT_CLINIC_OR_DEPARTMENT_OTHER): Payer: MEDICAID

## 2023-12-19 DIAGNOSIS — F17290 Nicotine dependence, other tobacco product, uncomplicated: Secondary | ICD-10-CM | POA: Diagnosis not present

## 2023-12-19 DIAGNOSIS — S6992XA Unspecified injury of left wrist, hand and finger(s), initial encounter: Secondary | ICD-10-CM | POA: Diagnosis not present

## 2023-12-19 DIAGNOSIS — F1120 Opioid dependence, uncomplicated: Secondary | ICD-10-CM | POA: Diagnosis not present

## 2023-12-19 DIAGNOSIS — M20022 Boutonniere deformity of left finger(s): Secondary | ICD-10-CM

## 2023-12-19 DIAGNOSIS — W19XXXA Unspecified fall, initial encounter: Secondary | ICD-10-CM | POA: Insufficient documentation

## 2023-12-19 DIAGNOSIS — S63639A Sprain of interphalangeal joint of unspecified finger, initial encounter: Secondary | ICD-10-CM

## 2023-12-19 DIAGNOSIS — S62609A Fracture of unspecified phalanx of unspecified finger, initial encounter for closed fracture: Secondary | ICD-10-CM

## 2023-12-19 DIAGNOSIS — S63283D Dislocation of proximal interphalangeal joint of left middle finger, subsequent encounter: Secondary | ICD-10-CM

## 2023-12-19 DIAGNOSIS — F419 Anxiety disorder, unspecified: Secondary | ICD-10-CM

## 2023-12-19 DIAGNOSIS — S63283A Dislocation of proximal interphalangeal joint of left middle finger, initial encounter: Secondary | ICD-10-CM | POA: Insufficient documentation

## 2023-12-19 DIAGNOSIS — Z01818 Encounter for other preprocedural examination: Secondary | ICD-10-CM

## 2023-12-19 DIAGNOSIS — S62603A Fracture of unspecified phalanx of left middle finger, initial encounter for closed fracture: Secondary | ICD-10-CM | POA: Diagnosis not present

## 2023-12-19 HISTORY — PX: OPEN REDUCTION INTERNAL FIXATION (ORIF) HAND: SHX5991

## 2023-12-19 LAB — POCT PREGNANCY, URINE: Preg Test, Ur: NEGATIVE

## 2023-12-19 SURGERY — OPEN REDUCTION INTERNAL FIXATION (ORIF) HAND
Anesthesia: Regional | Site: Hand | Laterality: Left

## 2023-12-19 MED ORDER — OXYCODONE HCL 5 MG PO TABS: 5.0000 mg | ORAL_TABLET | Freq: Four times a day (QID) | ORAL | 0 refills | Status: DC | PRN

## 2023-12-19 MED ORDER — HYDROMORPHONE HCL 1 MG/ML IJ SOLN: 0.2500 mg | INTRAMUSCULAR | Status: DC | PRN

## 2023-12-19 MED ORDER — EPHEDRINE SULFATE (PRESSORS) 50 MG/ML IJ SOLN
INTRAMUSCULAR | Status: DC | PRN
  Administered 2023-12-19: 10 mg via INTRAVENOUS
  Administered 2023-12-19 (×3): 5 mg via INTRAVENOUS

## 2023-12-19 MED ORDER — FENTANYL CITRATE (PF) 100 MCG/2ML IJ SOLN
INTRAMUSCULAR | Status: AC
  Filled 2023-12-19: qty 2

## 2023-12-19 MED ORDER — OXYCODONE HCL 5 MG PO TABS: 5.0000 mg | ORAL_TABLET | Freq: Once | ORAL | Status: DC | PRN

## 2023-12-19 MED ORDER — PROPOFOL 10 MG/ML IV BOLUS
INTRAVENOUS | Status: AC
Start: 2023-12-19 — End: 2023-12-19
  Filled 2023-12-19: qty 20

## 2023-12-19 MED ORDER — LACTATED RINGERS IV SOLN: INTRAVENOUS | Status: DC

## 2023-12-19 MED ORDER — ONDANSETRON HCL 4 MG/2ML IJ SOLN
INTRAMUSCULAR | Status: DC | PRN
Start: 2023-12-19 — End: 2023-12-19
  Administered 2023-12-19: 4 mg via INTRAVENOUS

## 2023-12-19 MED ORDER — FENTANYL CITRATE (PF) 100 MCG/2ML IJ SOLN
100.0000 ug | Freq: Once | INTRAMUSCULAR | Status: AC
  Administered 2023-12-19: 100 ug via INTRAVENOUS

## 2023-12-19 MED ORDER — ROPIVACAINE HCL 5 MG/ML IJ SOLN
INTRAMUSCULAR | Status: DC | PRN
  Administered 2023-12-19: 30 mL via PERINEURAL

## 2023-12-19 MED ORDER — MIDAZOLAM HCL 2 MG/2ML IJ SOLN
2.0000 mg | Freq: Once | INTRAMUSCULAR | Status: AC
  Administered 2023-12-19: 2 mg via INTRAVENOUS

## 2023-12-19 MED ORDER — MIDAZOLAM HCL 2 MG/2ML IJ SOLN
INTRAMUSCULAR | Status: AC
  Filled 2023-12-19: qty 2

## 2023-12-19 MED ORDER — CEFAZOLIN SODIUM-DEXTROSE 2-4 GM/100ML-% IV SOLN
INTRAVENOUS | Status: AC
  Filled 2023-12-19: qty 100

## 2023-12-19 MED ORDER — AMISULPRIDE (ANTIEMETIC) 5 MG/2ML IV SOLN: 10.0000 mg | Freq: Once | INTRAVENOUS | Status: DC | PRN

## 2023-12-19 MED ORDER — ACETAMINOPHEN 10 MG/ML IV SOLN: 1000.0000 mg | Freq: Once | INTRAVENOUS | Status: DC | PRN

## 2023-12-19 MED ORDER — LIDOCAINE 2% (20 MG/ML) 5 ML SYRINGE
INTRAMUSCULAR | Status: AC
Start: 2023-12-19 — End: 2023-12-19
  Filled 2023-12-19: qty 5

## 2023-12-19 MED ORDER — CEFAZOLIN SODIUM-DEXTROSE 2-4 GM/100ML-% IV SOLN
2.0000 g | INTRAVENOUS | Status: AC
  Administered 2023-12-19: 2 g via INTRAVENOUS

## 2023-12-19 MED ORDER — DEXMEDETOMIDINE HCL IN NACL 200 MCG/50ML IV SOLN
INTRAVENOUS | Status: DC | PRN
  Administered 2023-12-19: 12 ug via INTRAVENOUS

## 2023-12-19 MED ORDER — PROPOFOL 500 MG/50ML IV EMUL
INTRAVENOUS | Status: DC | PRN
  Administered 2023-12-19: 200 mg via INTRAVENOUS

## 2023-12-19 MED ORDER — ONDANSETRON HCL 4 MG/2ML IJ SOLN: 4.0000 mg | Freq: Once | INTRAMUSCULAR | Status: DC | PRN

## 2023-12-19 MED ORDER — DEXAMETHASONE SODIUM PHOSPHATE 4 MG/ML IJ SOLN
INTRAMUSCULAR | Status: DC | PRN
Start: 2023-12-19 — End: 2023-12-19
  Administered 2023-12-19: 5 mg via INTRAVENOUS

## 2023-12-19 MED ORDER — 0.9 % SODIUM CHLORIDE (POUR BTL) OPTIME
TOPICAL | Status: DC | PRN
  Administered 2023-12-19: 1000 mL

## 2023-12-19 MED ORDER — ONDANSETRON HCL 4 MG/2ML IJ SOLN
INTRAMUSCULAR | Status: AC
Start: 2023-12-19 — End: 2023-12-19
  Filled 2023-12-19: qty 2

## 2023-12-19 MED ORDER — PROPOFOL 500 MG/50ML IV EMUL
INTRAVENOUS | Status: AC
  Filled 2023-12-19: qty 50

## 2023-12-19 MED ORDER — OXYCODONE HCL 5 MG/5ML PO SOLN: 5.0000 mg | Freq: Once | ORAL | Status: DC | PRN

## 2023-12-19 SURGICAL SUPPLY — 42 items
BLADE SURG 15 STRL LF DISP TIS (BLADE) ×2 IMPLANT
BNDG COHESIVE 4X5 TAN STRL LF (GAUZE/BANDAGES/DRESSINGS) ×1 IMPLANT
BNDG COMPR ESMARK 4X3 LF (GAUZE/BANDAGES/DRESSINGS) ×1 IMPLANT
BNDG ELASTIC 4INX 5YD STR LF (GAUZE/BANDAGES/DRESSINGS) ×2 IMPLANT
BNDG GAUZE DERMACEA FLUFF 4 (GAUZE/BANDAGES/DRESSINGS) ×1 IMPLANT
CHLORAPREP W/TINT 26 (MISCELLANEOUS) ×1 IMPLANT
CORD BIPOLAR FORCEPS 12FT (ELECTRODE) ×1 IMPLANT
COVER BACK TABLE 60X90IN (DRAPES) ×1 IMPLANT
CUFF TOURN SGL QUICK 18X4 (TOURNIQUET CUFF) IMPLANT
CUFF TRNQT CYL 24X4X16.5-23 (TOURNIQUET CUFF) ×1 IMPLANT
DRAPE HAND 77X146 (DRAPES) ×1 IMPLANT
DRAPE OEC MINIVIEW 54X84 (DRAPES) ×1 IMPLANT
DRAPE SURG 17X23 STRL (DRAPES) ×1 IMPLANT
GAUZE SPONGE 4X4 12PLY STRL (GAUZE/BANDAGES/DRESSINGS) ×1 IMPLANT
GAUZE XEROFORM 1X8 LF (GAUZE/BANDAGES/DRESSINGS) ×1 IMPLANT
GLOVE BIO SURGEON STRL SZ7.5 (GLOVE) ×2 IMPLANT
GLOVE BIOGEL PI IND STRL 7.5 (GLOVE) ×1 IMPLANT
GOWN STRL REUS W/ TWL LRG LVL3 (GOWN DISPOSABLE) ×1 IMPLANT
GOWN STRL REUS W/TWL XL LVL3 (GOWN DISPOSABLE) ×1 IMPLANT
GOWN STRL SURGICAL XL XLNG (GOWN DISPOSABLE) ×2 IMPLANT
KWIRE DBL TROCAR .062X4 (WIRE) IMPLANT
MANIFOLD NEPTUNE II (INSTRUMENTS) ×1 IMPLANT
NDL HYPO 25X5/8 SAFETYGLIDE (NEEDLE) IMPLANT
NEEDLE HYPO 25X5/8 SAFETYGLIDE (NEEDLE) IMPLANT
NS IRRIG 1000ML POUR BTL (IV SOLUTION) IMPLANT
PACK BASIN DAY SURGERY FS (CUSTOM PROCEDURE TRAY) ×1 IMPLANT
PAD CAST 4YDX4 CTTN HI CHSV (CAST SUPPLIES) ×2 IMPLANT
SHEET MEDIUM DRAPE 40X70 STRL (DRAPES) ×1 IMPLANT
SLEEVE SCD COMPRESS KNEE MED (STOCKING) IMPLANT
SPIKE FLUID TRANSFER (MISCELLANEOUS) IMPLANT
SPLINT PLASTER CAST XFAST 4X15 (CAST SUPPLIES) ×10 IMPLANT
STOCKINETTE IMPERVIOUS 9X36 MD (GAUZE/BANDAGES/DRESSINGS) ×1 IMPLANT
SUCTION TUBE FRAZIER 10FR DISP (SUCTIONS) IMPLANT
SUT ETHILON 4 0 PS 2 18 (SUTURE) ×1 IMPLANT
SUT MNCRL AB 3-0 PS2 27 (SUTURE) ×1 IMPLANT
SUT VIC AB 3-0 FS2 27 (SUTURE) IMPLANT
SUT VIC AB 4-0 PS2 18 (SUTURE) IMPLANT
SYR BULB EAR ULCER 3OZ GRN STR (SYRINGE) ×2 IMPLANT
SYR CONTROL 10ML LL (SYRINGE) IMPLANT
TOWEL GREEN STERILE FF (TOWEL DISPOSABLE) ×2 IMPLANT
TUBE CONNECTING 20X1/4 (TUBING) IMPLANT
UNDERPAD 30X36 HEAVY ABSORB (UNDERPADS AND DIAPERS) ×1 IMPLANT

## 2023-12-19 NOTE — Anesthesia Postprocedure Evaluation (Signed)
 Anesthesia Post Note  Patient: Allison Woodard  Procedure(s) Performed: OPEN REDUCTION INTERNAL FIXATION (ORIF) HAND (Left: Hand)     Patient location during evaluation: PACU Anesthesia Type: Regional and General Level of consciousness: awake and alert Pain management: pain level controlled Vital Signs Assessment: post-procedure vital signs reviewed and stable Respiratory status: spontaneous breathing, nonlabored ventilation, respiratory function stable and patient connected to nasal cannula oxygen Cardiovascular status: blood pressure returned to baseline and stable Postop Assessment: no apparent nausea or vomiting Anesthetic complications: no   No notable events documented.  Last Vitals:  Vitals:   12/19/23 1449 12/19/23 1500  BP:  127/89  Pulse:  72  Resp:  19  Temp:    SpO2: 99% 99%    Last Pain:  Vitals:   12/19/23 1500  TempSrc:   PainSc: 0-No pain                 Garnette DELENA Gab

## 2023-12-19 NOTE — Discharge Instructions (Addendum)
 Hand Surgery Postop Instructions   Dressings: Maintain postoperative dressing until orthopedic follow-up.  Keep operative site clean and dry until orthopedic follow-up.  Wound Care: Keep your hand elevated above the level of your heart.  Do not allow it to dangle by your side. Moving your fingers is advised to stimulate circulation but will depend on the site of your surgery.  If you have a splint applied, your doctor will advise you regarding movement.  Activity: Do not drive or operate machinery until clearance given from physician. No heavy lifting with operative extremity.  Diet:  Drink liquids today or eat a light diet.  You may resume a regular diet tomorrow.    General expectations: Take prescribed medication if given, transition to over-the-counter medication as quickly as possible. Fingers may become slightly swollen.  Call your doctor if any of the following occur: Severe pain not relieved by pain medication. Elevated temperature. Dressing soaked with blood. Inability to move fingers. White or bluish color to fingers.   Per [facility] clinic policy, our goal is ensure optimal postoperative pain control with a multimodal pain management strategy. For all [facility] patients, our goal is to wean post-operative narcotic medications by 6 weeks post-operatively. If this is not possible due to utilization of pain medication prior to surgery, your [facility] doctor will support your acute post-operative pain control for the first 6 weeks postoperatively, with a plan to transition you back to your primary pain team following that. Cyndia Skeeters will work to ensure a Therapist, occupational.  Naeema Patlan Trevor Mace, M.D. Hand Surgery [facility]  Post Anesthesia Home Care Instructions  Activity: Get plenty of rest for the remainder of the day. A responsible individual must stay with you for 24 hours following the procedure.  For the next 24 hours, DO NOT: -Drive a  car -Advertising copywriter -Drink alcoholic beverages -Take any medication unless instructed by your physician -Make any legal decisions or sign important papers.  Meals: Start with liquid foods such as gelatin or soup. Progress to regular foods as tolerated. Avoid greasy, spicy, heavy foods. If nausea and/or vomiting occur, drink only clear liquids until the nausea and/or vomiting subsides. Call your physician if vomiting continues.  Special Instructions/Symptoms: Your throat may feel dry or sore from the anesthesia or the breathing tube placed in your throat during surgery. If this causes discomfort, gargle with warm salt water. The discomfort should disappear within 24 hours.  If you had a scopolamine patch placed behind your ear for the management of post- operative nausea and/or vomiting:  1. The medication in the patch is effective for 72 hours, after which it should be removed.  Wrap patch in a tissue and discard in the trash. Wash hands thoroughly with soap and water. 2. You may remove the patch earlier than 72 hours if you experience unpleasant side effects which may include dry mouth, dizziness or visual disturbances. 3. Avoid touching the patch. Wash your hands with soap and water after contact with the patch.    Regional Anesthesia Blocks  1. You may not be able to move or feel the "blocked" extremity after a regional anesthetic block. This may last may last from 3-48 hours after placement, but it will go away. The length of time depends on the medication injected and your individual response to the medication. As the nerves start to wake up, you may experience tingling as the movement and feeling returns to your extremity. If the numbness and inability to move your extremity  has not gone away after 48 hours, please call your surgeon.   2. The extremity that is blocked will need to be protected until the numbness is gone and the strength has returned. Because you cannot feel it, you  will need to take extra care to avoid injury. Because it may be weak, you may have difficulty moving it or using it. You may not know what position it is in without looking at it while the block is in effect.  3. For blocks in the legs and feet, returning to weight bearing and walking needs to be done carefully. You will need to wait until the numbness is entirely gone and the strength has returned. You should be able to move your leg and foot normally before you try and bear weight or walk. You will need someone to be with you when you first try to ensure you do not fall and possibly risk injury.  4. Bruising and tenderness at the needle site are common side effects and will resolve in a few days.  5. Persistent numbness or new problems with movement should be communicated to the surgeon or the [facility] [phone] [facility] [phone]).

## 2023-12-19 NOTE — Progress Notes (Signed)
 Assisted Dr. Richardson Landry with left, supraclavicular, ultrasound guided block. Side rails up, monitors on throughout procedure. See vital signs in flow sheet. Tolerated Procedure well.

## 2023-12-19 NOTE — Anesthesia Procedure Notes (Signed)
 Procedure Name: LMA Insertion Date/Time: 12/19/2023 1:38 PM  Performed by: Burnard Rosaline HERO, CRNAPre-anesthesia Checklist: Patient identified, Emergency Drugs available, Suction available and Patient being monitored Patient Re-evaluated:Patient Re-evaluated prior to induction Oxygen Delivery Method: Circle system utilized Preoxygenation: Pre-oxygenation with 100% oxygen Induction Type: IV induction Ventilation: Mask ventilation without difficulty LMA: LMA inserted LMA Size: 3.0 Number of attempts: 1 Airway Equipment and Method: Bite block Placement Confirmation: positive ETCO2, CO2 detector and breath sounds checked- equal and bilateral Tube secured with: Tape Dental Injury: Teeth and Oropharynx as per pre-operative assessment

## 2023-12-19 NOTE — Anesthesia Preprocedure Evaluation (Addendum)
 Anesthesia Evaluation  Patient identified by MRN, date of birth, ID band Patient awake    Reviewed: Allergy & Precautions, NPO status , Patient's Chart, lab work & pertinent test results  History of Anesthesia Complications Negative for: history of anesthetic complications  Airway Mallampati: II  TM Distance: >3 FB Neck ROM: Full    Dental no notable dental hx. (+) Teeth Intact, Dental Advisory Given   Pulmonary Current SmokerPatient did not abstain from smoking.   Pulmonary exam normal breath sounds clear to auscultation       Cardiovascular Normal cardiovascular exam Rhythm:Regular Rate:Normal     Neuro/Psych  PSYCHIATRIC DISORDERS Anxiety        GI/Hepatic ,,,(+)     substance abuse (hx)  cocaine use and methamphetamine use, Hepatitis -, C  Endo/Other    Renal/GU Lab Results      Component                Value               Date                      NA                       139                 12/12/2023                CL                       106                 12/12/2023                K                        3.9                 12/12/2023                CO2                      22                  12/12/2023                BUN                      13                  12/12/2023                CREATININE               0.60                12/12/2023                GFRNONAA                 >60                 12/12/2023                CALCIUM                  8.8 (L)  12/12/2023                 ALBUMIN                   3.1 (L)             12/12/2023                GLUCOSE                  95                  12/12/2023                Musculoskeletal  (+)  narcotic dependent  Abdominal   Peds  Hematology Lab Results      Component                Value               Date                      WBC                      9.9                 12/12/2023                HGB                      12.9                 12/12/2023                HCT                      38.0                12/12/2023                MCV                      92.2                12/12/2023                PLT                      394                 12/12/2023              Anesthesia Other Findings   Reproductive/Obstetrics                              Anesthesia Physical Anesthesia Plan  ASA: 3  Anesthesia Plan: Regional and General   Post-op Pain Management: Regional block* and Precedex    Induction:   PONV Risk Score and Plan: 2 and Propofol  infusion, Treatment may vary due to age or medical condition, Midazolam  and Ondansetron   Airway Management Planned: LMA  Additional Equipment: None  Intra-op Plan:   Post-operative Plan: Extubation in OR  Informed Consent: I have reviewed the patients History and Physical, chart, labs and discussed the procedure including the risks, benefits and alternatives for the proposed anesthesia with the patient or authorized representative who has indicated his/her  understanding and acceptance.     Dental advisory given  Plan Discussed with: CRNA and Surgeon  Anesthesia Plan Comments: (L supraclavicular + LMA)         Anesthesia Quick Evaluation

## 2023-12-19 NOTE — Anesthesia Procedure Notes (Signed)
 Anesthesia Regional Block: Supraclavicular block   Pre-Anesthetic Checklist: , timeout performed,  Correct Patient, Correct Site, Correct Laterality,  Correct Procedure, Correct Position, site marked,  Risks and benefits discussed,  Surgical consent,  Pre-op evaluation,  At surgeon's request and post-op pain management  Laterality: Upper and Left  Prep: chloraprep       Needles:  Injection technique: Single-shot  Needle Type: Echogenic Needle     Needle Length: 5cm  Needle Gauge: 21     Additional Needles:   Procedures:,,,, ultrasound used (permanent image in chart),,     Nerve Stimulator or Paresthesia:   Additional Responses:  Block tested.  Patient tolerated procedure well Narrative:  Start time: 12/19/2023 12:04 PM End time: 12/19/2023 12:11 PM Injection made incrementally with aspirations every 5 mL.  Performed by: Personally  Anesthesiologist: Jefm Garnette LABOR, MD  Additional Notes: Block tested. Patient tolerated procedure well.

## 2023-12-19 NOTE — Transfer of Care (Signed)
 Immediate Anesthesia Transfer of Care Note  Patient: Allison Woodard  Procedure(s) Performed: OPEN REDUCTION INTERNAL FIXATION (ORIF) HAND (Left: Hand)  Patient Location: PACU  Anesthesia Type:General and Regional  Level of Consciousness: awake and patient cooperative  Airway & Oxygen Therapy: Patient Spontanous Breathing and Patient connected to face mask oxygen  Post-op Assessment: Report given to RN and Post -op Vital signs reviewed and stable  Post vital signs: Reviewed and stable  Last Vitals:  Vitals Value Taken Time  BP 121/64 12/19/23 14:48  Temp    Pulse 64 12/19/23 14:51  Resp    SpO2 98 % 12/19/23 14:51  Vitals shown include unfiled device data.  Last Pain:  Vitals:   12/19/23 1151  TempSrc: Temporal  PainSc: 0-No pain      Patients Stated Pain Goal: 3 (12/19/23 1151)  Complications: No notable events documented.

## 2023-12-19 NOTE — Op Note (Signed)
 NAME: Daniqua Campoy MEDICAL RECORD NO: [id number] DATE OF BIRTH: Sep 15, 1990 FACILITY: [facility] LOCATION: [facility] PHYSICIAN: GILDARDO ALDERTON, MD   OPERATIVE REPORT   DATE OF PROCEDURE: 12/19/23    PREOPERATIVE DIAGNOSIS: Left long finger chronic PIP dislocation   POSTOPERATIVE DIAGNOSIS: Left long finger chronic PIP dislocation   PROCEDURE: Open reduction and fixation of chronic left long finger PIP dislocation Repair radial collateral ligament PIP left long finger Volar plate repair left long finger PIP Central slip repair with local tissue left long finger   SURGEON:  GILDARDO ALDERTON, M.D.   ASSISTANT: None   ANESTHESIA:  General   INTRAVENOUS FLUIDS:  Per anesthesia flow sheet.   ESTIMATED BLOOD LOSS:  Minimal.   COMPLICATIONS:  None.   SPECIMENS:  none   TOURNIQUET TIME:    Total Tourniquet Time Documented: Upper Arm (Left) - 36 minutes Total: Upper Arm (Left) - 36 minutes    DISPOSITION:  Stable to PACU.   INDICATIONS: 33 year old female who is in the outpatient setting with history of chronic left long finger PIP dislocation for multiple months prior.  She was initially lost to follow-up after unsuccessful close reduction attempt in the emergency department setting.  Patient was indicated for a left long finger open reduction and stabilization of the chronic PIP joint, we discussed the possibility of volar plate injury, such as with injury and possible collateral ligament injury.  Risks and benefits of surgery were discussed including the risks of infection, bleeding, scarring, stiffness, nerve injury, vascular injury, tendon injury, need for subsequent operation, persistent instability, lack of motion, recurrence, and possible conversion to fusion.  She voiced understanding of these risks and elected to proceed.  OPERATIVE COURSE: Patient was seen and identified in the preoperative area and marked appropriately.  Surgical consent had been signed.  Preoperative IV antibiotic prophylaxis was given. She was transferred to the operating room and placed in supine position with the Left upper extremity on an arm board.  General anesthesia was induced by the anesthesiologist. A regional block had been performed by anesthesia in preoperative holding.    Left upper extremity was prepped and draped in normal sterile orthopedic fashion.  A surgical pause was performed between the surgeons, anesthesia, and operating room staff and all were in agreement as to the patient, procedure, and site of procedure.  Tourniquet was placed and padded appropriately to the left upper arm.  The arm was exsanguinated and the tourniquet was inflated to 250 mmHg.  First began with a dorsal longitudinal incision over the PIP of the long finger.  Extensor splitting approach was performed to expose the underlying PIP joint.  Significant disruption was encountered of the central slip apparatus as expected.  There was notable shortening and deformity at the PIP joint with significant dorsal dislocation.  Reduction maneuver was attempted however the volar plate was noted to be interposed, this was serving as a mechanical block to attempt a reduction.  The ulnar collateral ligament was significantly tethered and preventing reduction.  Radial collateral ligament was noted to be disrupted.  Incision was this time to perform [facility] style incision on the volar aspect of the long finger as well in order to provide circumferential release of the PIP joint and 18 open reduction.  Given the complexity of this issue, volar approach was performed, neurovascular structures were protected and the underlying volar plate which was interposed was inspected.  Volar plate was then extricated from the PIP joint, there was noted to be a  significant rent in the volar plate partially detached from the proximal phalanx, which was repaired using 3-0 Vicryl and secured back to the proximal phalanx.  At this  juncture, the PIP joint was able to be reduced under left under fluoroscopy with standard reduction maneuver.  Significant instability was appreciated after reduction.  Additional 3-0 Vicryl was then utilized to repair the radial collateral ligament, both insertion points were noted to be intact, the tissue had been attenuated from the chronic dislocation and deviated position.  Central slip repair was then performed also utilizing 3-0 Vicryl, lateral bands were mobilized to recreate the central slip apparatus.  There was mild hyperextension of the DIP with tightening of the lateral bands, joint remained supple to passive motion.  At this juncture, the PIP joint had been secured and stabilized with circumferential soft tissue repair.  0.062 K wire was then introduced in antegrade fashion across the PIP joint in a slightly flexed posture for added stability.    The tourniquet was deflated at 36 minutes.  Fingertips were pink with brisk capillary refill after deflation of tourniquet.  Copious irrigation was performed of both the volar and dorsal incisional sites.  Skin closure was performed utilizing 4-0 nylon in standard fashion.  Clamshell splint was applied with the hand in the intrinsic plus position.  Sling was also applied.  The operative drapes were broken down.  The patient was awoken from anesthesia safely and taken to PACU in stable condition.   Post-operative plan: The patient will recover in the post-anesthesia care unit and then be discharged home.  The patient will be non weight bearing on the left upper extremity in a clamshell splint.   I will see the patient back in the office in 2 weeks for postoperative followup.  Discharge instructions were provided for pain control and dressing maintenance.  Charlot Gouin, MD Electronically signed, 12/19/23

## 2023-12-19 NOTE — Interval H&P Note (Signed)
 History and Physical Interval Note:  12/19/2023 11:47 AM  Allison Woodard  has presented today for surgery, with the diagnosis of LEFT LONG FINGER PIP CHRONIC DISLOCATION.  The various methods of treatment have been discussed with the patient and family. After consideration of risks, benefits and other options for treatment, the patient has consented to  Procedure(s) with comments: OPEN REDUCTION INTERNAL FIXATION (ORIF) HAND (Left) - LEFT LONG FINGER OPEN REDUCTION AND PINNING PIP JOINT as a surgical intervention.  The patient's history has been reviewed, patient examined, no change in status, stable for surgery.  I have reviewed the patient's chart and labs.  Questions were answered to the patient's satisfaction.     Joani Cosma

## 2023-12-20 ENCOUNTER — Encounter (HOSPITAL_BASED_OUTPATIENT_CLINIC_OR_DEPARTMENT_OTHER): Payer: Self-pay | Admitting: Orthopedic Surgery

## 2023-12-20 NOTE — Plan of Care (Signed)
 Spoke with patient over the phone regarding surgery from yesterday.  Having some discoloration in distal aspect of digit. Block wearing off, some persistent numbness.  Advised to unwrap dressing to take pressure off and elevated to help reduce swelling. If color does not improve, advised to come into ER for evaluation.  Patient expressed understanding.   Ellena Kamen, MD Hand Surgery, [place]

## 2023-12-22 ENCOUNTER — Telehealth: Payer: Self-pay

## 2023-12-22 NOTE — Telephone Encounter (Signed)
 Called pt and LMOM to get scheduled asap

## 2023-12-23 ENCOUNTER — Encounter: Payer: MEDICAID | Admitting: Orthopedic Surgery

## 2023-12-23 ENCOUNTER — Telehealth: Payer: Self-pay

## 2023-12-23 NOTE — Telephone Encounter (Signed)
 Called pt to get her scheduled for an appointment with Dr. Erwin with tomorrow 12/24/23 as she does not have a splint on anymore as she had taken it off on Saturday. She was supposed to come in today 12/23/2023 but no showed due to no transportation. Myself and Dr. Erwin spoke with her and emphasized how important it was for her to come in tomorrow.

## 2023-12-24 ENCOUNTER — Telehealth: Payer: Self-pay | Admitting: Orthopedic Surgery

## 2023-12-24 ENCOUNTER — Ambulatory Visit: Payer: MEDICAID | Admitting: Orthopedic Surgery

## 2023-12-24 ENCOUNTER — Other Ambulatory Visit: Payer: Self-pay | Admitting: Orthopedic Surgery

## 2023-12-24 ENCOUNTER — Encounter: Payer: MEDICAID | Admitting: Orthopedic Surgery

## 2023-12-24 DIAGNOSIS — S63283A Dislocation of proximal interphalangeal joint of left middle finger, initial encounter: Secondary | ICD-10-CM

## 2023-12-24 MED ORDER — OXYCODONE HCL 5 MG PO TABS: 5.0000 mg | ORAL_TABLET | Freq: Four times a day (QID) | ORAL | 0 refills | Status: DC | PRN

## 2023-12-24 NOTE — Telephone Encounter (Signed)
 Patient called. Says her RX wasn't called in to [facility].

## 2023-12-24 NOTE — Progress Notes (Signed)
   Allison Woodard - 33 y.o. female MRN [id number]  Date of birth: 1990-05-10  Office Visit Note: Visit Date: 12/24/2023 PCP: Pcp, No Referred by: No ref. provider found  Subjective:  HPI: Allison Woodard is a 33 y.o. female who presents today for follow up 5 days status post Open reduction and fixation of chronic left long finger PIP dislocation, repair radial collateral ligament PIP left long finger, volar plate repair left long finger PIP, and central slip repair with local tissue left long finger.  Pertinent ROS were reviewed with the patient and found to be negative unless otherwise specified above in HPI.   Assessment & Plan: Visit Diagnoses:  1. Closed traumatic dislocation of proximal interphalangeal (PIP) joint of left middle finger     Plan: Pin site remains clean dry and intact.  Appropriate color and capillary refill to the digit.  Digit remains currently insensate at the distal pulp, likely from traction neuropraxia.  Prefabricated splint was applied today for the hand to protect the hand and pin site.  Follow-up in approximately 10 days for repeat evaluation and likely suture removal at that time.  Follow-up: No follow-ups on file.   Meds & Orders: No orders of the defined types were placed in this encounter.  No orders of the defined types were placed in this encounter.    Procedures: No procedures performed       Objective:   Vital Signs: LMP 12/04/2023 (Approximate)   Ortho Exam Long finger with pin in place at PIP region, clean dry and intact without erythema or drainage, distal aspect of the digit with appropriate color and capillary refill, sensation remains significantly diminished at the distal pulp of the digit  Imaging: No results found.   Allison Woodard Allison Woodard, M.D. [facility], Hand Surgery

## 2024-01-01 ENCOUNTER — Telehealth: Payer: Self-pay

## 2024-01-01 ENCOUNTER — Encounter: Payer: MEDICAID | Admitting: Orthopedic Surgery

## 2024-01-01 NOTE — Telephone Encounter (Signed)
 Called pt to get appointment asap due to her missing her appointment today, with Dr. Erwin next week 02/04/24. Pt said she will call back and schedule.

## 2024-01-05 ENCOUNTER — Encounter: Payer: MEDICAID | Admitting: Orthopedic Surgery

## 2024-01-08 ENCOUNTER — Encounter: Payer: MEDICAID | Admitting: Orthopedic Surgery

## 2024-01-08 ENCOUNTER — Telehealth: Payer: Self-pay | Admitting: Orthopedic Surgery

## 2024-01-08 ENCOUNTER — Telehealth: Payer: Self-pay

## 2024-01-08 NOTE — Telephone Encounter (Signed)
 Pt called stating she missed her appt again for postop. Pt has several no show and cancellations. Pt call pt for post op appt. Pt phone number is [phone]

## 2024-01-08 NOTE — Telephone Encounter (Signed)
 Tried to call pt to get appointment scheduled as she no showed for her appointment today 01/08/2024. When the phone was answered I was unable to hear anything from the pt's side of the call. Will try to call again later

## 2024-01-08 NOTE — Telephone Encounter (Signed)
 Called pt and scheduled for 10/6 at 8:30 am

## 2024-01-12 ENCOUNTER — Encounter: Payer: MEDICAID | Admitting: Orthopedic Surgery

## 2024-01-13 ENCOUNTER — Other Ambulatory Visit: Payer: Self-pay | Admitting: Orthopedic Surgery

## 2024-01-13 ENCOUNTER — Other Ambulatory Visit (INDEPENDENT_AMBULATORY_CARE_PROVIDER_SITE_OTHER): Payer: MEDICAID

## 2024-01-13 ENCOUNTER — Ambulatory Visit (INDEPENDENT_AMBULATORY_CARE_PROVIDER_SITE_OTHER): Payer: MEDICAID | Admitting: Orthopedic Surgery

## 2024-01-13 DIAGNOSIS — S63283A Dislocation of proximal interphalangeal joint of left middle finger, initial encounter: Secondary | ICD-10-CM | POA: Diagnosis not present

## 2024-01-13 MED ORDER — OXYCODONE HCL 5 MG PO TABS: 5.0000 mg | ORAL_TABLET | Freq: Four times a day (QID) | ORAL | 0 refills | Status: DC | PRN

## 2024-01-13 NOTE — Progress Notes (Signed)
   Allison Woodard - 33 y.o. female MRN [id number]  Date of birth: June 13, 1990  Office Visit Note: Visit Date: 01/13/2024 PCP: Pcp, No Referred by: No ref. provider found  Subjective:  HPI: Allison Woodard is a 33 y.o. female who presents today for follow up 3 weeks status post Open reduction and fixation of chronic left long finger PIP dislocation, repair radial collateral ligament PIP left long finger, volar plate repair left long finger PIP, and central slip repair with local tissue left long finger.  Has been noncompliant with follow-ups and has removed her splint.  He has ongoing pain and swelling at the right long finger, pin with notable irritation.  Pertinent ROS were reviewed with the patient and found to be negative unless otherwise specified above in HPI.   Assessment & Plan: Visit Diagnoses:  1. Closed traumatic dislocation of proximal interphalangeal (PIP) joint of left middle finger     Plan: Unfortunately, likely due to aggressive motion and lack of immobilization, her joint remains unstable, the pin has failed.  She will need revision closed versus open reduction of the PIP and repeat pinning.  I explained the extent of the joint destruction already and the likelihood that a lot of the soft tissue repair has failed given her lack of compliance with the immobilization at this point.  I explained that long-term, we may be looking at potential PIP fusion versus partial amputation given the extent of the damage in this finger.  We will reattempt urgent closed versus open reduction of the PIP and repeat pinning this week.  Risks and benefits of the procedure were discussed, risks including but not limited to infection, bleeding, scarring, stiffness, nerve injury, tendon injury, vascular injury, hardware complication, recurrence of symptoms and need for subsequent operation.  We also discussed the appropriate postoperative protocol and timeframe for return to activities and function.  Patient  expressed understanding.     Follow-up: No follow-ups on file.   Meds & Orders: No orders of the defined types were placed in this encounter.   Orders Placed This Encounter  Procedures   XR Finger Middle Left     Procedures: No procedures performed       Objective:   Vital Signs: There were no vitals taken for this visit.    Ortho Exam Left long finger PIP with notable deformity, ongoing swelling and tenderness in this region, pin removed today, sutures remain in place, normal color and capillary refill distally  Imaging: XR Finger Middle Left Result Date: 01/13/2024 X-rays of the left long finger demonstrate volar dislocation of the PIP.  Significant bony destruction is seen at the joint surface with bony debris visible.    Benjermin Korber [name], M.D. [facility], Hand Surgery

## 2024-01-13 NOTE — H&P (View-Only) (Signed)
   Allison Woodard - 33 y.o. female MRN [id number]  Date of birth: June 13, 1990  Office Visit Note: Visit Date: 01/13/2024 PCP: Pcp, No Referred by: No ref. provider found  Subjective:  HPI: Allison Woodard is a 33 y.o. female who presents today for follow up 3 weeks status post Open reduction and fixation of chronic left long finger PIP dislocation, repair radial collateral ligament PIP left long finger, volar plate repair left long finger PIP, and central slip repair with local tissue left long finger.  Has been noncompliant with follow-ups and has removed her splint.  He has ongoing pain and swelling at the right long finger, pin with notable irritation.  Pertinent ROS were reviewed with the patient and found to be negative unless otherwise specified above in HPI.   Assessment & Plan: Visit Diagnoses:  1. Closed traumatic dislocation of proximal interphalangeal (PIP) joint of left middle finger     Plan: Unfortunately, likely due to aggressive motion and lack of immobilization, her joint remains unstable, the pin has failed.  She will need revision closed versus open reduction of the PIP and repeat pinning.  I explained the extent of the joint destruction already and the likelihood that a lot of the soft tissue repair has failed given her lack of compliance with the immobilization at this point.  I explained that long-term, we may be looking at potential PIP fusion versus partial amputation given the extent of the damage in this finger.  We will reattempt urgent closed versus open reduction of the PIP and repeat pinning this week.  Risks and benefits of the procedure were discussed, risks including but not limited to infection, bleeding, scarring, stiffness, nerve injury, tendon injury, vascular injury, hardware complication, recurrence of symptoms and need for subsequent operation.  We also discussed the appropriate postoperative protocol and timeframe for return to activities and function.  Patient  expressed understanding.     Follow-up: No follow-ups on file.   Meds & Orders: No orders of the defined types were placed in this encounter.   Orders Placed This Encounter  Procedures   XR Finger Middle Left     Procedures: No procedures performed       Objective:   Vital Signs: There were no vitals taken for this visit.    Ortho Exam Left long finger PIP with notable deformity, ongoing swelling and tenderness in this region, pin removed today, sutures remain in place, normal color and capillary refill distally  Imaging: XR Finger Middle Left Result Date: 01/13/2024 X-rays of the left long finger demonstrate volar dislocation of the PIP.  Significant bony destruction is seen at the joint surface with bony debris visible.    Benjermin Korber [name], M.D. [facility], Hand Surgery

## 2024-01-14 ENCOUNTER — Encounter (HOSPITAL_BASED_OUTPATIENT_CLINIC_OR_DEPARTMENT_OTHER): Payer: Self-pay | Admitting: Orthopedic Surgery

## 2024-01-15 NOTE — Anesthesia Preprocedure Evaluation (Signed)
 Anesthesia Evaluation    Reviewed: Allergy & Precautions, Patient's Chart, lab work & pertinent test results, Unable to perform ROS - Chart review only  History of Anesthesia Complications Negative for: history of anesthetic complications  Airway        Dental   Pulmonary Current SmokerPatient did not abstain from smoking.   Pulmonary exam normal        Cardiovascular Normal cardiovascular exam     Neuro/Psych  PSYCHIATRIC DISORDERS Anxiety        GI/Hepatic ,,,(+)     substance abuse (hx)  cocaine use and methamphetamine use, Hepatitis -, C  Endo/Other    Renal/GU Lab Results      Component                Value               Date                      NA                       139                 12/12/2023                CL                       106                 12/12/2023                K                        3.9                 12/12/2023                CO2                      22                  12/12/2023                BUN                      13                  12/12/2023                CREATININE               0.60                12/12/2023                GFRNONAA                 >60                 12/12/2023                CALCIUM                  8.8 (L)             12/12/2023                 ALBUMIN   3.1 (L)             12/12/2023                GLUCOSE                  95                  12/12/2023                Musculoskeletal  (+)  narcotic dependent  Abdominal   Peds  Hematology  (+) Blood dyscrasia, anemia Lab Results      Component                Value               Date                      WBC                      9.9                 12/12/2023                HGB                      12.9                12/12/2023                HCT                      38.0                12/12/2023                MCV                      92.2                12/12/2023                 PLT                      394                 12/12/2023              Anesthesia Other Findings   Reproductive/Obstetrics                              Anesthesia Physical Anesthesia Plan  ASA: 3  Anesthesia Plan: Regional   Post-op Pain Management: Regional block* and Precedex    Induction:   PONV Risk Score and Plan: 1 and Propofol  infusion, Treatment may vary due to age or medical condition, Midazolam , Ondansetron  and TIVA  Airway Management Planned:   Additional Equipment: None  Intra-op Plan:   Post-operative Plan:   Informed Consent:   Plan Discussed with:   Anesthesia Plan Comments:          Anesthesia Quick Evaluation

## 2024-01-16 ENCOUNTER — Encounter (HOSPITAL_BASED_OUTPATIENT_CLINIC_OR_DEPARTMENT_OTHER): Payer: Self-pay | Admitting: Anesthesiology

## 2024-01-16 ENCOUNTER — Ambulatory Visit (HOSPITAL_BASED_OUTPATIENT_CLINIC_OR_DEPARTMENT_OTHER): Admission: RE | Admit: 2024-01-16 | Payer: MEDICAID | Source: Home / Self Care | Admitting: Orthopedic Surgery

## 2024-01-16 DIAGNOSIS — Z01818 Encounter for other preprocedural examination: Secondary | ICD-10-CM

## 2024-01-16 SURGERY — OPEN REDUCTION INTERNAL FIXATION (ORIF) HAND
Anesthesia: General | Site: Hand | Laterality: Left

## 2024-01-16 NOTE — Plan of Care (Signed)
 HAND SURGERY UPDATE NOTE:  Patient unable to come for surgery today due to incarceration last night.  Will discuss with her current facility regarding rescheduling of her left long finger chronic PIP dislocation.  Her current diagnosis is chronic in nature and nonemergent.  Patient has been noncompliant with follow-up recommendations and instructions to date, we were unaware of her upcoming incarceration prior to surgical scheduling.  Allison Oberry, MD Hand Surgery, [place]

## 2024-01-20 ENCOUNTER — Telehealth: Payer: Self-pay | Admitting: Orthopedic Surgery

## 2024-01-20 NOTE — Telephone Encounter (Signed)
 I tried to reach patient in regards to rescheduling surgery. Someone answered but hung up without saying anything. I called back and it went to voicemail. Voicemail was full so I could not leave a message. Tried reaching patients boyfriend. Call when to voicemail. I did not leave a message.

## 2024-01-27 ENCOUNTER — Encounter: Payer: MEDICAID | Admitting: Orthopedic Surgery

## 2024-01-28 ENCOUNTER — Encounter (HOSPITAL_BASED_OUTPATIENT_CLINIC_OR_DEPARTMENT_OTHER): Payer: Self-pay | Admitting: Orthopedic Surgery

## 2024-01-28 ENCOUNTER — Other Ambulatory Visit: Payer: Self-pay

## 2024-01-29 ENCOUNTER — Encounter: Payer: MEDICAID | Admitting: Orthopedic Surgery

## 2024-01-30 ENCOUNTER — Other Ambulatory Visit: Payer: Self-pay

## 2024-01-30 ENCOUNTER — Ambulatory Visit (HOSPITAL_BASED_OUTPATIENT_CLINIC_OR_DEPARTMENT_OTHER)
Admission: RE | Admit: 2024-01-30 | Discharge: 2024-01-30 | Disposition: A | Discharge status: Home / Self Care | Payer: MEDICAID | Attending: Orthopedic Surgery | Admitting: Orthopedic Surgery

## 2024-01-30 ENCOUNTER — Encounter: Admission: RE | Disposition: A | Payer: Self-pay | Attending: Orthopedic Surgery

## 2024-01-30 ENCOUNTER — Encounter (HOSPITAL_BASED_OUTPATIENT_CLINIC_OR_DEPARTMENT_OTHER): Payer: Self-pay | Admitting: Orthopedic Surgery

## 2024-01-30 ENCOUNTER — Ambulatory Visit (HOSPITAL_BASED_OUTPATIENT_CLINIC_OR_DEPARTMENT_OTHER): Payer: MEDICAID | Admitting: Certified Registered Nurse Anesthetist

## 2024-01-30 ENCOUNTER — Ambulatory Visit (HOSPITAL_BASED_OUTPATIENT_CLINIC_OR_DEPARTMENT_OTHER): Payer: MEDICAID

## 2024-01-30 DIAGNOSIS — M24445 Recurrent dislocation, left finger: Secondary | ICD-10-CM | POA: Insufficient documentation

## 2024-01-30 DIAGNOSIS — S62603G Fracture of unspecified phalanx of left middle finger, subsequent encounter for fracture with delayed healing: Secondary | ICD-10-CM | POA: Diagnosis not present

## 2024-01-30 DIAGNOSIS — Z01818 Encounter for other preprocedural examination: Secondary | ICD-10-CM

## 2024-01-30 DIAGNOSIS — Z91199 Patient's noncompliance with other medical treatment and regimen due to unspecified reason: Secondary | ICD-10-CM | POA: Diagnosis not present

## 2024-01-30 DIAGNOSIS — F419 Anxiety disorder, unspecified: Secondary | ICD-10-CM

## 2024-01-30 DIAGNOSIS — S63283D Dislocation of proximal interphalangeal joint of left middle finger, subsequent encounter: Secondary | ICD-10-CM | POA: Diagnosis not present

## 2024-01-30 HISTORY — PX: CLOSED REDUCTION FINGER WITH PERCUTANEOUS PINNING: SHX5612

## 2024-01-30 LAB — POCT PREGNANCY, URINE: Preg Test, Ur: NEGATIVE

## 2024-01-30 SURGERY — CLOSED REDUCTION, FINGER, WITH PERCUTANEOUS PINNING
Anesthesia: General | Site: Finger | Laterality: Left

## 2024-01-30 MED ORDER — OXYCODONE HCL 5 MG PO TABS
5.0000 mg | ORAL_TABLET | Freq: Once | ORAL | Status: AC | PRN
  Administered 2024-01-30: 5 mg via ORAL

## 2024-01-30 MED ORDER — FENTANYL CITRATE (PF) 100 MCG/2ML IJ SOLN
INTRAMUSCULAR | Status: DC | PRN
  Administered 2024-01-30: 50 ug via INTRAVENOUS
  Administered 2024-01-30 (×2): 25 ug via INTRAVENOUS

## 2024-01-30 MED ORDER — 0.9 % SODIUM CHLORIDE (POUR BTL) OPTIME
TOPICAL | Status: DC | PRN
  Administered 2024-01-30: 500 mL

## 2024-01-30 MED ORDER — ROPIVACAINE HCL 5 MG/ML IJ SOLN
INTRAMUSCULAR | Status: AC
  Filled 2024-01-30: qty 30

## 2024-01-30 MED ORDER — ONDANSETRON HCL 4 MG/2ML IJ SOLN
INTRAMUSCULAR | Status: DC | PRN
  Administered 2024-01-30: 4 mg via INTRAVENOUS

## 2024-01-30 MED ORDER — MIDAZOLAM HCL 5 MG/5ML IJ SOLN
INTRAMUSCULAR | Status: DC | PRN
  Administered 2024-01-30: 2 mg via INTRAVENOUS

## 2024-01-30 MED ORDER — LIDOCAINE 2% (20 MG/ML) 5 ML SYRINGE
INTRAMUSCULAR | Status: AC
  Filled 2024-01-30: qty 5

## 2024-01-30 MED ORDER — OXYCODONE HCL 5 MG PO TABS
ORAL_TABLET | ORAL | Status: AC
  Filled 2024-01-30: qty 1

## 2024-01-30 MED ORDER — PROPOFOL 10 MG/ML IV BOLUS
INTRAVENOUS | Status: AC
  Filled 2024-01-30: qty 20

## 2024-01-30 MED ORDER — FENTANYL CITRATE (PF) 100 MCG/2ML IJ SOLN: 25.0000 ug | INTRAMUSCULAR | Status: DC | PRN

## 2024-01-30 MED ORDER — KETOROLAC TROMETHAMINE 30 MG/ML IJ SOLN
INTRAMUSCULAR | Status: AC
  Filled 2024-01-30: qty 1

## 2024-01-30 MED ORDER — LIDOCAINE HCL (PF) 1 % IJ SOLN
INTRAMUSCULAR | Status: DC | PRN
  Administered 2024-01-30: 10 mL

## 2024-01-30 MED ORDER — CEFAZOLIN SODIUM-DEXTROSE 2-4 GM/100ML-% IV SOLN
2.0000 g | INTRAVENOUS | Status: AC
  Administered 2024-01-30: 2 g via INTRAVENOUS

## 2024-01-30 MED ORDER — LIDOCAINE HCL (CARDIAC) PF 100 MG/5ML IV SOSY
PREFILLED_SYRINGE | INTRAVENOUS | Status: DC | PRN
  Administered 2024-01-30: 60 mg via INTRAVENOUS

## 2024-01-30 MED ORDER — DEXAMETHASONE SOD PHOSPHATE PF 10 MG/ML IJ SOLN
INTRAMUSCULAR | Status: DC | PRN
  Administered 2024-01-30: 10 mg via INTRAVENOUS

## 2024-01-30 MED ORDER — MIDAZOLAM HCL 2 MG/2ML IJ SOLN
INTRAMUSCULAR | Status: AC
  Filled 2024-01-30: qty 2

## 2024-01-30 MED ORDER — CEFAZOLIN SODIUM-DEXTROSE 2-4 GM/100ML-% IV SOLN
INTRAVENOUS | Status: AC
  Filled 2024-01-30: qty 100

## 2024-01-30 MED ORDER — OXYCODONE HCL 5 MG PO TABS: 5.0000 mg | ORAL_TABLET | Freq: Four times a day (QID) | ORAL | 0 refills | Status: AC | PRN

## 2024-01-30 MED ORDER — ONDANSETRON HCL 4 MG/2ML IJ SOLN
INTRAMUSCULAR | Status: AC
  Filled 2024-01-30: qty 2

## 2024-01-30 MED ORDER — KETOROLAC TROMETHAMINE 30 MG/ML IJ SOLN
30.0000 mg | Freq: Once | INTRAMUSCULAR | Status: AC | PRN
  Administered 2024-01-30: 30 mg via INTRAVENOUS

## 2024-01-30 MED ORDER — LIDOCAINE-EPINEPHRINE (PF) 1 %-1:200000 IJ SOLN
INTRAMUSCULAR | Status: AC
  Filled 2024-01-30: qty 30

## 2024-01-30 MED ORDER — PROPOFOL 10 MG/ML IV BOLUS
INTRAVENOUS | Status: DC | PRN
  Administered 2024-01-30: 200 mg via INTRAVENOUS

## 2024-01-30 MED ORDER — AMISULPRIDE (ANTIEMETIC) 5 MG/2ML IV SOLN: 10.0000 mg | Freq: Once | INTRAVENOUS | Status: DC | PRN

## 2024-01-30 MED ORDER — LACTATED RINGERS IV SOLN: INTRAVENOUS | Status: DC

## 2024-01-30 MED ORDER — DEXMEDETOMIDINE HCL IN NACL 80 MCG/20ML IV SOLN
INTRAVENOUS | Status: DC | PRN
  Administered 2024-01-30: 8 ug via INTRAVENOUS
  Administered 2024-01-30: 12 ug via INTRAVENOUS

## 2024-01-30 MED ORDER — ACETAMINOPHEN 10 MG/ML IV SOLN: 1000.0000 mg | Freq: Once | INTRAVENOUS | Status: DC | PRN

## 2024-01-30 MED ORDER — FENTANYL CITRATE (PF) 100 MCG/2ML IJ SOLN
INTRAMUSCULAR | Status: AC
  Filled 2024-01-30: qty 2

## 2024-01-30 MED ORDER — OXYCODONE HCL 5 MG/5ML PO SOLN: 5.0000 mg | Freq: Once | ORAL | Status: AC | PRN

## 2024-01-30 SURGICAL SUPPLY — 48 items
BLADE SURG 15 STRL LF DISP TIS (BLADE) ×3 IMPLANT
BNDG COHESIVE 4X5 TAN STRL LF (GAUZE/BANDAGES/DRESSINGS) ×2 IMPLANT
BNDG COMPR ESMARK 4X3 LF (GAUZE/BANDAGES/DRESSINGS) ×1 IMPLANT
BNDG ELASTIC 3INX 5YD STR LF (GAUZE/BANDAGES/DRESSINGS) ×1 IMPLANT
BNDG ELASTIC 4INX 5YD STR LF (GAUZE/BANDAGES/DRESSINGS) ×2 IMPLANT
BNDG GAUZE DERMACEA FLUFF 4 (GAUZE/BANDAGES/DRESSINGS) ×1 IMPLANT
CHLORAPREP W/TINT 26 (MISCELLANEOUS) ×2 IMPLANT
CORD BIPOLAR FORCEPS 12FT (ELECTRODE) ×1 IMPLANT
COVER BACK TABLE 60X90IN (DRAPES) ×2 IMPLANT
CUFF TOURN SGL QUICK 18X4 (TOURNIQUET CUFF) ×1 IMPLANT
CUFF TRNQT CYL 24X4X16.5-23 (TOURNIQUET CUFF) ×2 IMPLANT
DRAPE HAND 77X146 (DRAPES) ×2 IMPLANT
DRAPE OEC MINIVIEW 54X84 (DRAPES) ×3 IMPLANT
DRAPE SURG 17X23 STRL (DRAPES) ×2 IMPLANT
GAUZE PAD ABD 8X10 STRL (GAUZE/BANDAGES/DRESSINGS) ×2 IMPLANT
GAUZE SPONGE 4X4 12PLY STRL (GAUZE/BANDAGES/DRESSINGS) ×2 IMPLANT
GAUZE STRETCH 2X75IN STRL (MISCELLANEOUS) ×2 IMPLANT
GAUZE XEROFORM 1X8 LF (GAUZE/BANDAGES/DRESSINGS) ×2 IMPLANT
GLOVE BIO SURGEON STRL SZ7 (GLOVE) ×1 IMPLANT
GLOVE BIO SURGEON STRL SZ7.5 (GLOVE) ×4 IMPLANT
GLOVE BIOGEL PI IND STRL 7.0 (GLOVE) ×1 IMPLANT
GLOVE BIOGEL PI IND STRL 7.5 (GLOVE) ×3 IMPLANT
GOWN STRL REUS W/ TWL LRG LVL3 (GOWN DISPOSABLE) ×1 IMPLANT
GOWN STRL REUS W/ TWL XL LVL3 (GOWN DISPOSABLE) ×1 IMPLANT
GOWN STRL REUS W/TWL XL LVL3 (GOWN DISPOSABLE) ×1 IMPLANT
GOWN STRL SURGICAL XL XLNG (GOWN DISPOSABLE) ×3 IMPLANT
KWIRE DBL .062X4 NSTRL (WIRE) ×2 IMPLANT
MANIFOLD NEPTUNE II (INSTRUMENTS) ×2 IMPLANT
NDL HYPO 25X1 1.5 SAFETY (NEEDLE) IMPLANT
NDL HYPO 25X5/8 SAFETYGLIDE (NEEDLE) IMPLANT
NEEDLE HYPO 25X1 1.5 SAFETY (NEEDLE) IMPLANT
NEEDLE HYPO 25X5/8 SAFETYGLIDE (NEEDLE) ×2 IMPLANT
PACK BASIN DAY SURGERY FS (CUSTOM PROCEDURE TRAY) ×2 IMPLANT
PAD CAST 4YDX4 CTTN HI CHSV (CAST SUPPLIES) ×4 IMPLANT
SHEET MEDIUM DRAPE 40X70 STRL (DRAPES) ×2 IMPLANT
SLEEVE SCD COMPRESS KNEE MED (STOCKING) IMPLANT
SPIKE FLUID TRANSFER (MISCELLANEOUS) IMPLANT
SPLINT PLASTER CAST XFAST 4X15 (CAST SUPPLIES) ×20 IMPLANT
STOCKINETTE IMPERVIOUS 9X36 MD (GAUZE/BANDAGES/DRESSINGS) ×2 IMPLANT
SUCTION TUBE FRAZIER 10FR DISP (SUCTIONS) IMPLANT
SUT ETHILON 4 0 PS 2 18 (SUTURE) ×1 IMPLANT
SUT MNCRL AB 3-0 PS2 27 (SUTURE) ×1 IMPLANT
SUT VIC AB 4-0 PS2 18 (SUTURE) IMPLANT
SYR BULB EAR ULCER 3OZ GRN STR (SYRINGE) ×4 IMPLANT
SYR CONTROL 10ML LL (SYRINGE) ×1 IMPLANT
TOWEL GREEN STERILE FF (TOWEL DISPOSABLE) ×4 IMPLANT
TUBE CONNECTING 20X1/4 (TUBING) IMPLANT
UNDERPAD 30X36 HEAVY ABSORB (UNDERPADS AND DIAPERS) ×2 IMPLANT

## 2024-01-30 NOTE — Progress Notes (Signed)
 Dr. Arlinda is aware that patient has denies multiple time that she doesn't have any intentions of hurting herself or others. Ok to proceed.

## 2024-01-30 NOTE — Anesthesia Preprocedure Evaluation (Addendum)
 Anesthesia Evaluation  Patient identified by MRN, date of birth, ID band Patient awake    Reviewed: Allergy & Precautions, NPO status , Patient's Chart, lab work & pertinent test results  Airway Mallampati: III       Dental no notable dental hx.    Pulmonary Current Smoker   Pulmonary exam normal        Cardiovascular negative cardio ROS Normal cardiovascular exam     Neuro/Psych  PSYCHIATRIC DISORDERS Anxiety     PTSD (post-traumatic stress disorder)negative neurological ROS     GI/Hepatic negative GI ROS,,,(+)     substance abuse  , Hepatitis -  Endo/Other  negative endocrine ROS    Renal/GU negative Renal ROS     Musculoskeletal   Abdominal   Peds  Hematology negative hematology ROS (+)   Anesthesia Other Findings LEFT LONG FINGER CHRONIC PIP DISLOCATION  Reproductive/Obstetrics Hcg negative                              Anesthesia Physical Anesthesia Plan  ASA: 3  Anesthesia Plan: General   Post-op Pain Management:    Induction: Intravenous  PONV Risk Score and Plan: 2 and Ondansetron , Dexamethasone , Midazolam  and Treatment may vary due to age or medical condition  Airway Management Planned: LMA  Additional Equipment:   Intra-op Plan:   Post-operative Plan: Extubation in OR  Informed Consent: I have reviewed the patients History and Physical, chart, labs and discussed the procedure including the risks, benefits and alternatives for the proposed anesthesia with the patient or authorized representative who has indicated his/her understanding and acceptance.     Dental advisory given  Plan Discussed with: CRNA  Anesthesia Plan Comments:         Anesthesia Quick Evaluation

## 2024-01-30 NOTE — Discharge Instructions (Addendum)
    Hand Surgery Postop Instructions   Dressings: Maintain postoperative dressing until orthopedic follow-up.  Keep operative site clean and dry until orthopedic follow-up.  Wound Care: Keep your hand elevated above the level of your heart.  Do not allow it to dangle by your side. Moving your fingers is advised to stimulate circulation but will depend on the site of your surgery.  If you have a splint applied, your doctor will advise you regarding movement.  Activity: Do not drive or operate machinery until clearance given from physician. No heavy lifting with operative extremity.  Diet:  Drink liquids today or eat a light diet.  You may resume a regular diet tomorrow.    General expectations: Take prescribed medication if given, transition to over-the-counter medication as quickly as possible. Fingers may become slightly swollen.  Call your doctor if any of the following occur: Severe pain not relieved by pain medication. Elevated temperature. Dressing soaked with blood. Inability to move fingers. White or bluish color to fingers.   Per [facility] clinic policy, our goal is ensure optimal postoperative pain control with a multimodal pain management strategy. For all [facility] patients, our goal is to wean post-operative narcotic medications by 6 weeks post-operatively. If this is not possible due to utilization of pain medication prior to surgery, your [facility] doctor will support your acute post-operative pain control for the first 6 weeks postoperatively, with a plan to transition you back to your primary pain team following that. Maralee will work to ensure a Therapist, occupational.  Anshul [name], M.D. Hand Surgery [facility]    Post Anesthesia Home Care Instructions  Activity: Get plenty of rest for the remainder of the day. A responsible individual must stay with you for 24 hours following the procedure.  For the next 24 hours, DO NOT: -Drive a  car -Advertising copywriter -Drink alcoholic beverages -Take any medication unless instructed by your physician -Make any legal decisions or sign important papers.  Meals: Start with liquid foods such as gelatin or soup. Progress to regular foods as tolerated. Avoid greasy, spicy, heavy foods. If nausea and/or vomiting occur, drink only clear liquids until the nausea and/or vomiting subsides. Call your physician if vomiting continues.  Special Instructions/Symptoms: Your throat may feel dry or sore from the anesthesia or the breathing tube placed in your throat during surgery. If this causes discomfort, gargle with warm salt water . The discomfort should disappear within 24 hours.  If you had a scopolamine patch placed behind your ear for the management of post- operative nausea and/or vomiting:  1. The medication in the patch is effective for 72 hours, after which it should be removed.  Wrap patch in a tissue and discard in the trash. Wash hands thoroughly with soap and water . 2. You may remove the patch earlier than 72 hours if you experience unpleasant side effects which may include dry mouth, dizziness or visual disturbances. 3. Avoid touching the patch. Wash your hands with soap and water  after contact with the patch.     Last received oxycodone  at 345pm No ibuprofen /NSAIDs until after 12am

## 2024-01-30 NOTE — Op Note (Signed)
 NAME: Allison Woodard MEDICAL RECORD NO: [id number] DATE OF BIRTH: 11/20/1990 FACILITY: [facility] LOCATION: [facility] PHYSICIAN: GILDARDO ALDERTON, MD   OPERATIVE REPORT   DATE OF PROCEDURE: 01/30/24    PREOPERATIVE DIAGNOSIS: Left long finger chronic PIP dislocation   POSTOPERATIVE DIAGNOSIS: Left long finger chronic PIP dislocation   PROCEDURE: Left long finger closed reduction and pinning PIP dislocation   SURGEON:  GILDARDO ALDERTON, M.D.   ASSISTANT: Joesph Hooks, OPA   ANESTHESIA:  General   INTRAVENOUS FLUIDS:  Per anesthesia flow sheet.   ESTIMATED BLOOD LOSS:  Minimal.   COMPLICATIONS:  None.   SPECIMENS:  none   TOURNIQUET TIME: Not utilized    DISPOSITION:  Stable to PACU.   INDICATIONS: 33 year old female who undergone previous left long finger open reduction and stabilization for a chronic PIP dislocation.  Patient had been noncompliant with postoperative follow-up, had removed the splint and had persistent instability with failure of the initial stabilization.  Initially, patient had dorsal subluxation of the PIP that was chronic in nature, she subsequently presented with volar instability.  There is noted to be significant bone loss and joint destruction at the PIP site.  Extensive discussion was had with the patient regarding potential arthrodesis versus partial amputation, patient elected to have repeat close reduction and pinning.  I explained that the likelihood of stabilization at this juncture given the chronicity and prior failure was unlikely.  Risks and benefits of surgery were discussed including the risks of infection, bleeding, scarring, stiffness, nerve injury, vascular injury, tendon injury, persistent instability and need for subsequent operation.  She voiced understanding of these risks and elected to proceed.  OPERATIVE COURSE: Patient was seen and identified in the preoperative area and marked appropriately.  Surgical consent had been  signed. Preoperative IV antibiotic prophylaxis was given. She was transferred to the operating room and placed in supine position with the Left upper extremity on an arm board.  General anesthesia was induced by the anesthesiologist.  Left upper extremity was prepped and draped in normal sterile orthopedic fashion.  A surgical pause was performed between the surgeons, anesthesia, and operating room staff and all were in agreement as to the patient, procedure, and site of procedure.  Tourniquet was placed and padded appropriately to the left upper arm however was not utilized for this case.  Closed reduction maneuver was attempted under biplanar fluoroscopy.  There was significant lack of mobility at the PIP site due to the chronicity of this problem, the joint had become essentially locked in the volar flexed position.  The joint surface of the proximal phalanx had eroded and allowed the middle phalanx to essentially key into the distal aspect of the proximal phalanx, nearly an autofusion.  0.062 K wires x2 were then introduced in antegrade fashion in cross pin fashion to create stability with the current position that the PIP joint is in.  There remained partial persistent volar subluxation of the PIP due to the significant scar tissue and lack of mobility.  In order to allow for ongoing autofusion of the bone for increased ability, the joint was left in this partially volarly subluxed position with pinning given the persistent instability and inability to recreate the joint surface.  Wires were then bent and cut appropriately.  Sterile dressings were applied followed by application of a clamshell orthosis utilizing plaster.  The operative drapes were broken down.  The patient was awoken from anesthesia safely and taken to PACU in stable condition.   Post-operative  plan: The patient will recover in the post-anesthesia care unit and then be discharged home.  The patient will be non weight bearing on the left  upper extremity in a clamshell splint.   I will see the patient back in the office in 2 weeks for postoperative followup.  Extensive discussion was had postoperatively as well with the patient regarding the persistent volar subluxation of the joint with the pinning, there is high likelihood to require formalized fusion in the future.  Jerryl Holzhauer, MD Electronically signed, 01/30/24

## 2024-01-30 NOTE — Anesthesia Procedure Notes (Signed)
 Procedure Name: LMA Insertion Date/Time: 01/30/2024 1:52 PM  Performed by: Debarah Chiquita LABOR, CRNAPre-anesthesia Checklist: Patient identified, Emergency Drugs available, Suction available and Patient being monitored Patient Re-evaluated:Patient Re-evaluated prior to induction Oxygen Delivery Method: Circle system utilized Preoxygenation: Pre-oxygenation with 100% oxygen Induction Type: IV induction Ventilation: Mask ventilation without difficulty LMA: LMA inserted LMA Size: 4.0 Number of attempts: 1 Airway Equipment and Method: Bite block Placement Confirmation: positive ETCO2 Tube secured with: Tape Dental Injury: Teeth and Oropharynx as per pre-operative assessment

## 2024-01-30 NOTE — Interval H&P Note (Signed)
 History and Physical Interval Note:  01/30/2024 1:30 PM  Allison Woodard  has presented today for surgery, with the diagnosis of LEFT LONG FINGER CHRONIC PIP DISLOCATION.  The various methods of treatment have been discussed with the patient and family. After consideration of risks, benefits and other options for treatment, the patient has consented to  Procedure(s): Left long finger closed pinning for chronic PIP dislocation (Left) vs. open reduction and pinning for chronic PIP dislocation (Left) as a surgical intervention.  The patient's history has been reviewed, patient examined, no change in status, stable for surgery.  I have reviewed the patient's chart and labs.  Questions were answered to the patient's satisfaction.     Ripley Lovecchio

## 2024-01-30 NOTE — Transfer of Care (Signed)
 Immediate Anesthesia Transfer of Care Note  Patient: Allison Woodard  Procedure(s) Performed: Left long finger closed pinning for chronic Proximal Interphalangeal dislocation (Left: Finger)  Patient Location: PACU  Anesthesia Type:General  Level of Consciousness: drowsy  Airway & Oxygen Therapy: Patient Spontanous Breathing and Patient connected to face mask oxygen  Post-op Assessment: Report given to RN and Post -op Vital signs reviewed and stable  Post vital signs: Reviewed and stable  Last Vitals:  Vitals Value Taken Time  BP 96/70   Temp    Pulse 67 01/30/24 14:46  Resp 12   SpO2 99 % 01/30/24 14:46  Vitals shown include unfiled device data.  Last Pain:  Vitals:   01/30/24 1246  TempSrc: Temporal  PainSc: 5          Complications: No notable events documented.

## 2024-01-30 NOTE — Anesthesia Postprocedure Evaluation (Signed)
 Anesthesia Post Note  Patient: Allison Woodard  Procedure(s) Performed: Left long finger closed pinning for chronic Proximal Interphalangeal dislocation (Left: Finger)     Patient location during evaluation: PACU Anesthesia Type: General Level of consciousness: awake Pain management: pain level controlled Vital Signs Assessment: post-procedure vital signs reviewed and stable Respiratory status: spontaneous breathing, nonlabored ventilation and respiratory function stable Cardiovascular status: blood pressure returned to baseline and stable Postop Assessment: no apparent nausea or vomiting Anesthetic complications: no   No notable events documented.  Last Vitals:  Vitals:   01/30/24 1530 01/30/24 1545  BP: 119/87 113/73  Pulse: 84 92  Resp: 13 16  Temp:    SpO2: 98% 99%    Last Pain:  Vitals:   01/30/24 1545  TempSrc:   PainSc: 0-No pain                 Argenis Kumari P Teasia Zapf

## 2024-01-31 ENCOUNTER — Encounter (HOSPITAL_BASED_OUTPATIENT_CLINIC_OR_DEPARTMENT_OTHER): Payer: Self-pay | Admitting: Orthopedic Surgery

## 2024-02-09 ENCOUNTER — Encounter: Payer: Self-pay | Admitting: Radiology

## 2024-02-10 NOTE — Progress Notes (Deleted)
   Allison Woodard - 33 y.o. female MRN [id number]  Date of birth: [birth year]  Office Visit Note: Visit Date: 02/11/2024 PCP: Pcp, No Referred by: No ref. provider found  Subjective:  HPI: Allison Woodard is a 33 y.o. female who presents today for follow up 10 days status post left long finger closed reduction and pinning of the chronic PIP dislocation.***  Pertinent ROS were reviewed with the patient and found to be negative unless otherwise specified above in HPI.   Assessment & Plan: Visit Diagnoses:  1. Closed traumatic dislocation of proximal interphalangeal (PIP) joint of left middle finger     Plan: Given her ongoing volar subluxation of the PIP joint despite multiple reduction attempts, I did once again explain the chronic instability of this joint moving forward as well as the underlying destruction of the PIP region at the articular surface.  This is due to previous noncompliance with follow-up, delay of initial treatment and noncompliance postoperatively as well after the initial reduction.  At this juncture, recommend remains PIP arthrodesis versus possible partial amputation.***  Follow-up: No follow-ups on file.   Meds & Orders: No orders of the defined types were placed in this encounter.  No orders of the defined types were placed in this encounter.    Procedures: No procedures performed       Objective:   Vital Signs: LMP 01/11/2024   Ortho Exam ***  Imaging: No results found.   Raynell Scott [name], M.D. [facility], Hand Surgery

## 2024-02-11 ENCOUNTER — Encounter: Payer: MEDICAID | Admitting: Orthopedic Surgery

## 2024-02-11 ENCOUNTER — Telehealth: Payer: Self-pay

## 2024-02-11 DIAGNOSIS — S63283A Dislocation of proximal interphalangeal joint of left middle finger, initial encounter: Secondary | ICD-10-CM

## 2024-02-11 NOTE — Telephone Encounter (Signed)
 Tried to call pt to schedule with Dr. Erwin ASAP for a post op appointment. Phone rung and then said call can not be completed as dialed. Tried calling x2.

## 2024-02-17 ENCOUNTER — Other Ambulatory Visit (INDEPENDENT_AMBULATORY_CARE_PROVIDER_SITE_OTHER): Payer: Self-pay

## 2024-02-17 ENCOUNTER — Ambulatory Visit (INDEPENDENT_AMBULATORY_CARE_PROVIDER_SITE_OTHER): Payer: MEDICAID | Admitting: Orthopedic Surgery

## 2024-02-17 DIAGNOSIS — S63283A Dislocation of proximal interphalangeal joint of left middle finger, initial encounter: Secondary | ICD-10-CM

## 2024-02-17 NOTE — Progress Notes (Signed)
   Allison Woodard - 33 y.o. female MRN [id number]  Date of birth: [birth year]  Office Visit Note: Visit Date: 02/17/2024 PCP: Pcp, No Referred by: No ref. provider found  Subjective:  HPI: Allison Woodard is a 33 y.o. female who presents today for follow up 2.5 weeks status post Left long finger closed reduction and pinning PIP dislocation .  Pertinent ROS were reviewed with the patient and found to be negative unless otherwise specified above in HPI.   Assessment & Plan: Visit Diagnoses:  1. Closed traumatic dislocation of proximal interphalangeal (PIP) joint of left middle finger     Plan: Pins were removed today.  Unfortunately, her long finger PIP has remained chronically unstable, there has been too much soft tissue compromise from the chronicity of this injury with notable degeneration at the joint surface.  As discussed previously, this joint will need either an arthrodesis or partial amputation at this point.  Risk and benefits of both procedures were discussed in detail today.  Patient is welcome to follow-up with me when she is able to proceed with either of these procedures.  Currently, she is incarcerated, we can wait until this process has resolved before proceeding with arthrodesis.  Follow-up: No follow-ups on file.   Meds & Orders: No orders of the defined types were placed in this encounter.   Orders Placed This Encounter  Procedures   XR Finger Middle Left     Procedures: No procedures performed       Objective:   Vital Signs: LMP 01/11/2024 Comment: negative UPT  Ortho Exam Left long finger pins removed today, pin sites clean dry and intact, no erythema or drainage, minimal motion at the long finger PIP  Imaging: XR Finger Middle Left Result Date: 02/17/2024 Left long finger x-rays demonstrate persistent volar subluxation of the PIP joint, notable degeneration at the articular surface    Edwards Mckelvie Estela) Yanet Balliet, M.D. [facility], Hand Surgery
